# Patient Record
Sex: Female | Born: 1980 | Race: Black or African American | Hispanic: No | Marital: Single | Smoking: Current every day smoker
Health system: Southern US, Community
[De-identification: ages and names within clinical notes are randomized; demographics above are authoritative.]

## PROBLEM LIST (undated history)

## (undated) DIAGNOSIS — Z975 Presence of (intrauterine) contraceptive device: Secondary | ICD-10-CM

## (undated) DIAGNOSIS — N898 Other specified noninflammatory disorders of vagina: Secondary | ICD-10-CM

## (undated) DIAGNOSIS — B009 Herpesviral infection, unspecified: Secondary | ICD-10-CM

## (undated) DIAGNOSIS — N923 Ovulation bleeding: Secondary | ICD-10-CM

## (undated) DIAGNOSIS — O24419 Gestational diabetes mellitus in pregnancy, unspecified control: Secondary | ICD-10-CM

## (undated) DIAGNOSIS — E119 Type 2 diabetes mellitus without complications: Secondary | ICD-10-CM

## (undated) DIAGNOSIS — T8383XA Hemorrhage of genitourinary prosthetic devices, implants and grafts, initial encounter: Secondary | ICD-10-CM

## (undated) HISTORY — DX: Presence of (intrauterine) contraceptive device: Z97.5

## (undated) HISTORY — DX: Herpesviral infection, unspecified: B00.9

## (undated) HISTORY — PX: NO PAST SURGERIES: SHX2092

## (undated) HISTORY — DX: Ovulation bleeding: N92.3

## (undated) HISTORY — DX: Hemorrhage due to genitourinary prosthetic devices, implants and grafts, initial encounter: T83.83XA

## (undated) HISTORY — DX: Other specified noninflammatory disorders of vagina: N89.8

## (undated) HISTORY — DX: Gestational diabetes mellitus in pregnancy, unspecified control: O24.419

---

## 2007-03-08 ENCOUNTER — Emergency Department (HOSPITAL_COMMUNITY): Admission: EM | Admit: 2007-03-08 | Discharge: 2007-03-08 | Payer: Self-pay | Admitting: Emergency Medicine

## 2007-03-11 ENCOUNTER — Ambulatory Visit (HOSPITAL_COMMUNITY): Admission: RE | Admit: 2007-03-11 | Discharge: 2007-03-11 | Payer: Self-pay | Admitting: Nurse Practitioner

## 2008-12-14 ENCOUNTER — Emergency Department (HOSPITAL_COMMUNITY): Admission: EM | Admit: 2008-12-14 | Discharge: 2008-12-15 | Payer: Self-pay | Admitting: Emergency Medicine

## 2008-12-31 ENCOUNTER — Emergency Department (HOSPITAL_COMMUNITY): Admission: EM | Admit: 2008-12-31 | Discharge: 2008-12-31 | Payer: Self-pay | Admitting: Family Medicine

## 2009-07-13 ENCOUNTER — Inpatient Hospital Stay (HOSPITAL_COMMUNITY): Admission: AD | Admit: 2009-07-13 | Discharge: 2009-07-13 | Payer: Self-pay | Admitting: Obstetrics & Gynecology

## 2009-07-24 ENCOUNTER — Inpatient Hospital Stay (HOSPITAL_COMMUNITY): Admission: AD | Admit: 2009-07-24 | Discharge: 2009-07-24 | Payer: Self-pay | Admitting: Obstetrics and Gynecology

## 2009-09-20 ENCOUNTER — Inpatient Hospital Stay (HOSPITAL_COMMUNITY): Admission: AD | Admit: 2009-09-20 | Discharge: 2009-09-21 | Payer: Self-pay | Admitting: Obstetrics & Gynecology

## 2009-10-03 ENCOUNTER — Ambulatory Visit (HOSPITAL_COMMUNITY): Admission: RE | Admit: 2009-10-03 | Discharge: 2009-10-03 | Payer: Self-pay | Admitting: Obstetrics

## 2009-10-22 ENCOUNTER — Emergency Department (HOSPITAL_COMMUNITY): Admission: EM | Admit: 2009-10-22 | Discharge: 2009-10-22 | Payer: Self-pay | Admitting: Emergency Medicine

## 2009-12-11 ENCOUNTER — Inpatient Hospital Stay (HOSPITAL_COMMUNITY): Admission: AD | Admit: 2009-12-11 | Discharge: 2009-12-11 | Payer: Self-pay | Admitting: Obstetrics & Gynecology

## 2010-01-30 ENCOUNTER — Emergency Department (HOSPITAL_COMMUNITY): Admission: EM | Admit: 2010-01-30 | Discharge: 2010-01-30 | Payer: Self-pay | Admitting: Emergency Medicine

## 2010-02-21 ENCOUNTER — Encounter: Payer: Self-pay | Admitting: Obstetrics

## 2010-02-21 ENCOUNTER — Inpatient Hospital Stay (HOSPITAL_COMMUNITY): Admission: AD | Admit: 2010-02-21 | Discharge: 2010-02-23 | Payer: Self-pay | Admitting: Obstetrics

## 2010-07-24 IMAGING — US US OB COMP LESS 14 WK
1 series · 14 of 28 positions shown · non-contrast
Comparison: 12/15/2008.

CLINICAL DATA: 28-year-old female 6 weeks pregnant with bleeding.

OBSTETRIC <14 WK ULTRASOUND
TECHNIQUE: Transabdominal ultrasound was performed for evaluation
of the gestation as well as the maternal uterus and adnexal
regions.

[Series 1: us ob comp less 14 wks · 0.24mm/px · 14 of 43 slices shown]
[im 2/43]
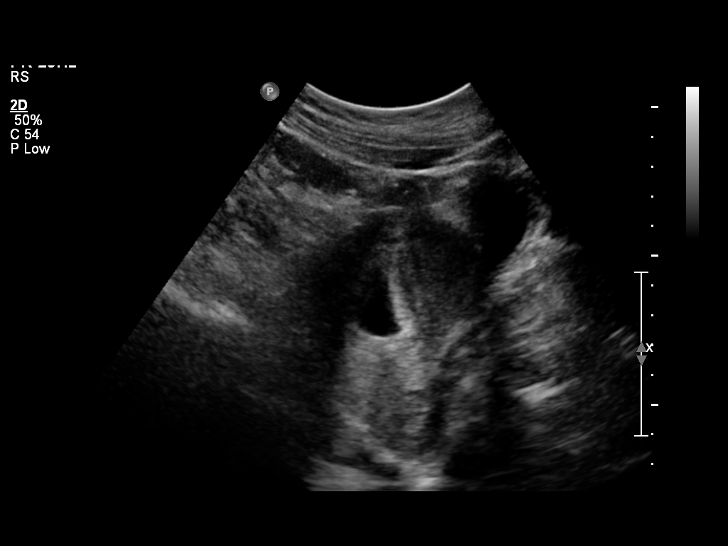
[im 5/43]
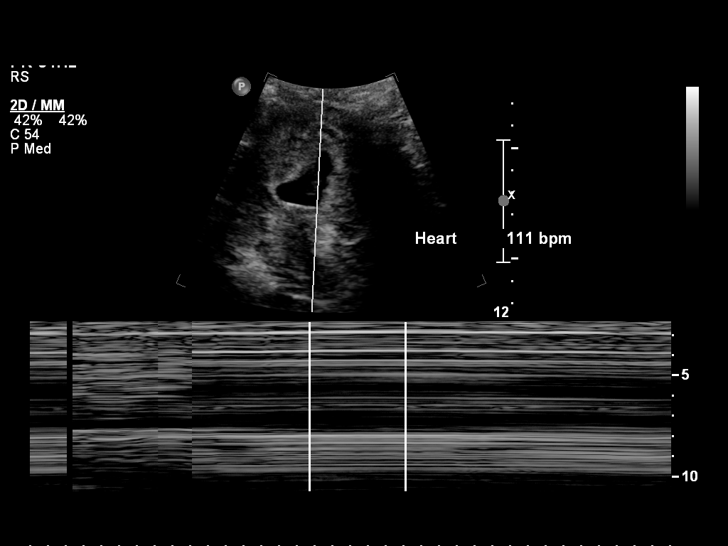
[im 8/43]
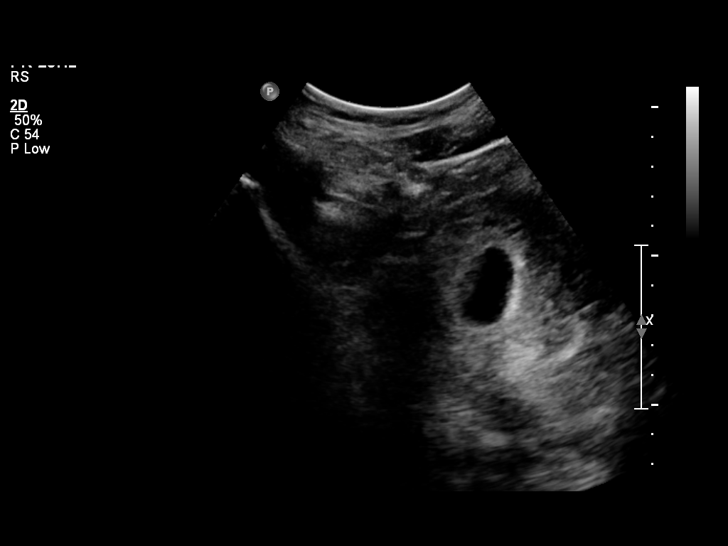
[im 11/43]
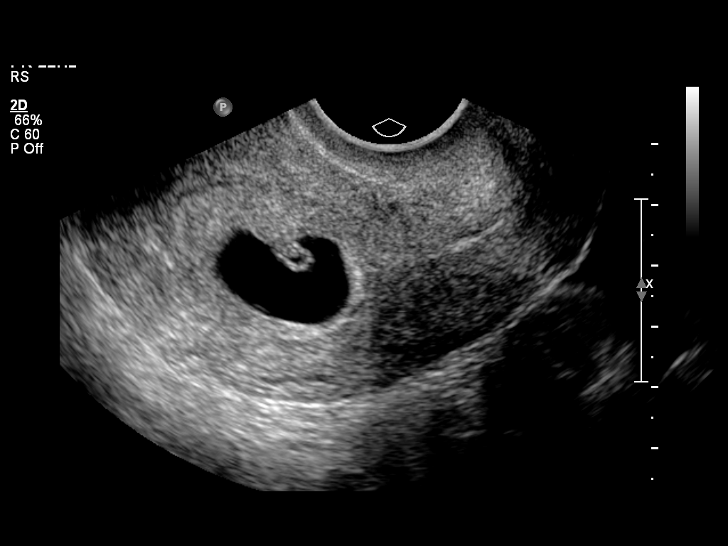
[im 15/43]
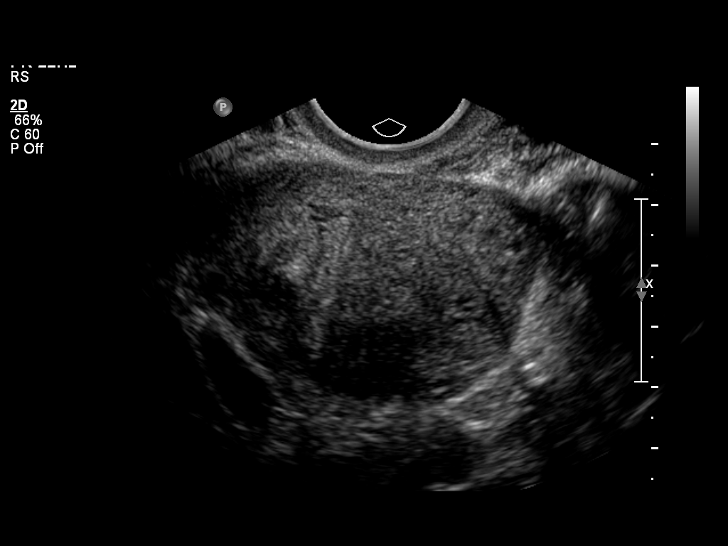
[im 18/43]
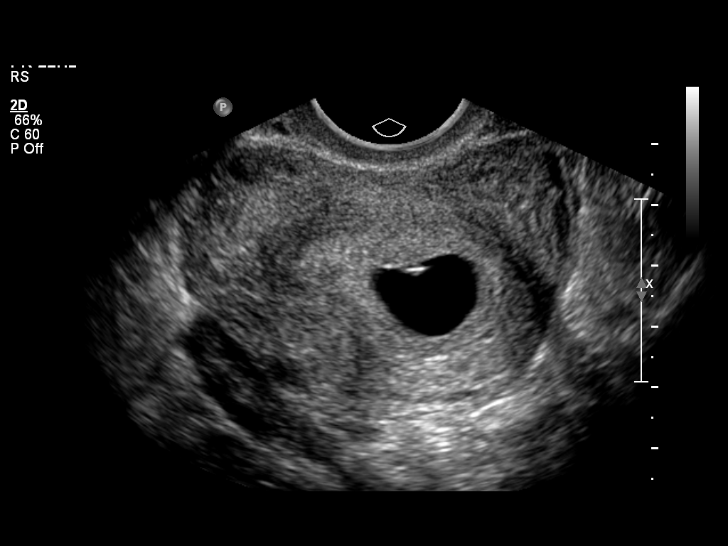
[im 21/43]
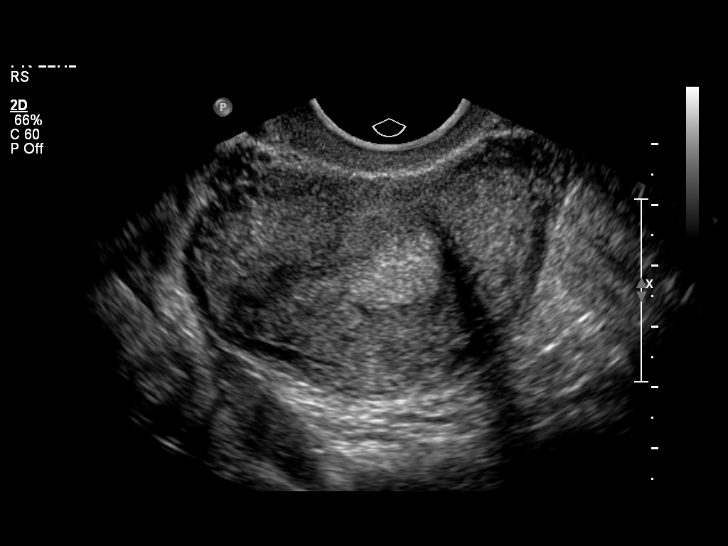
[im 24/43]
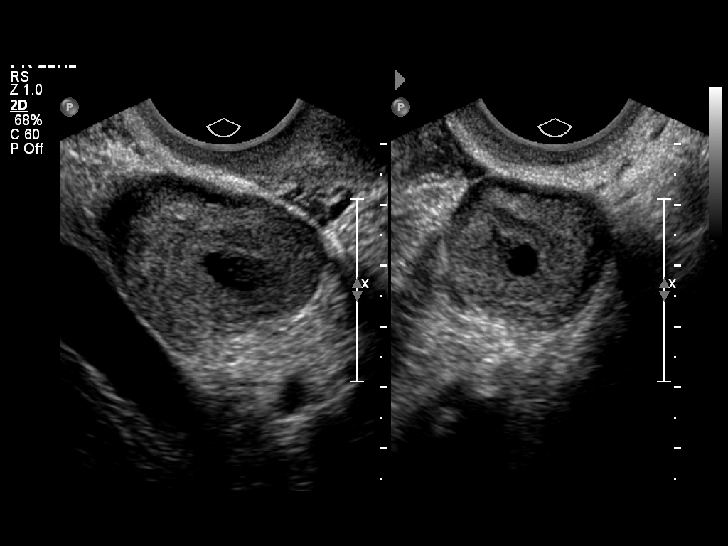
[im 27/43]
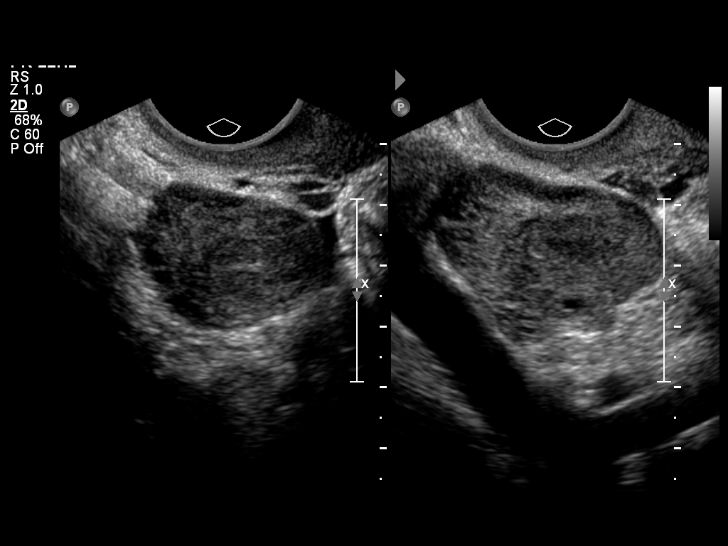
[im 30/43]
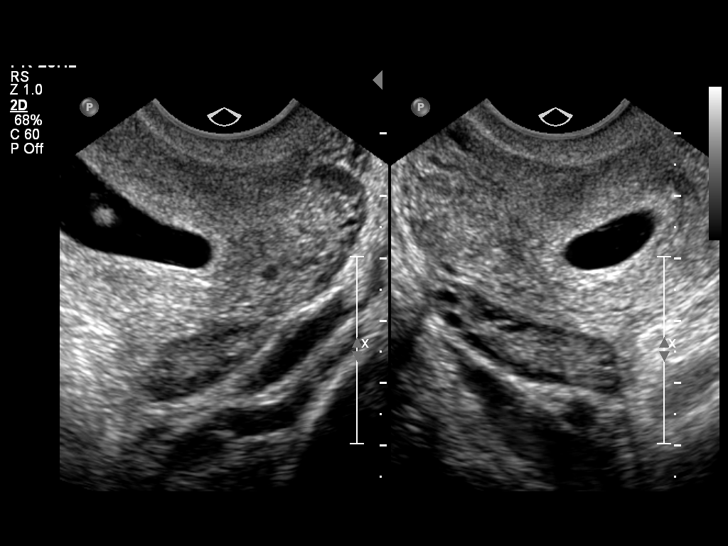
[im 33/43]
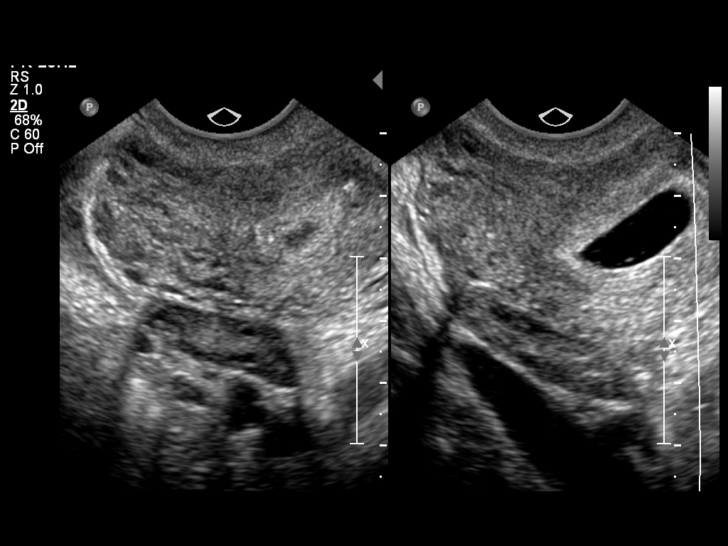
[im 36/43]
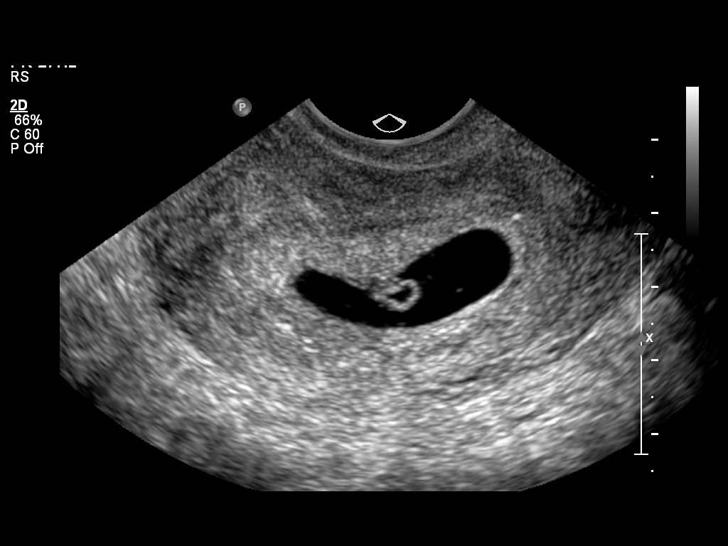
[im 39/43]
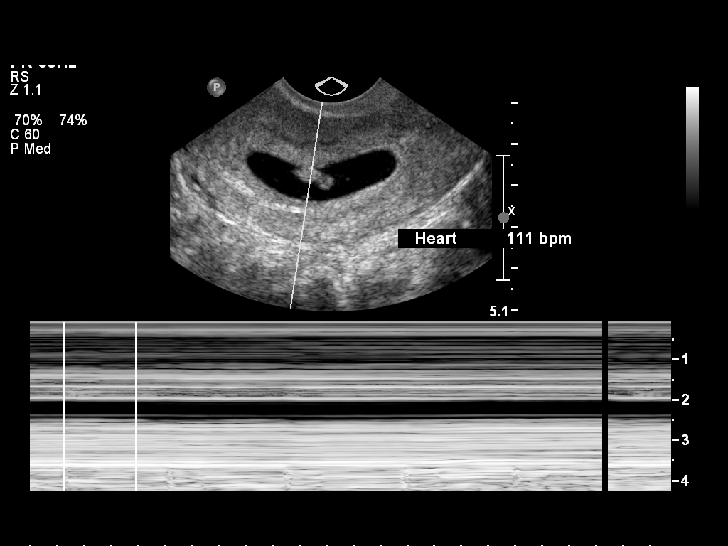
[im 43/43]
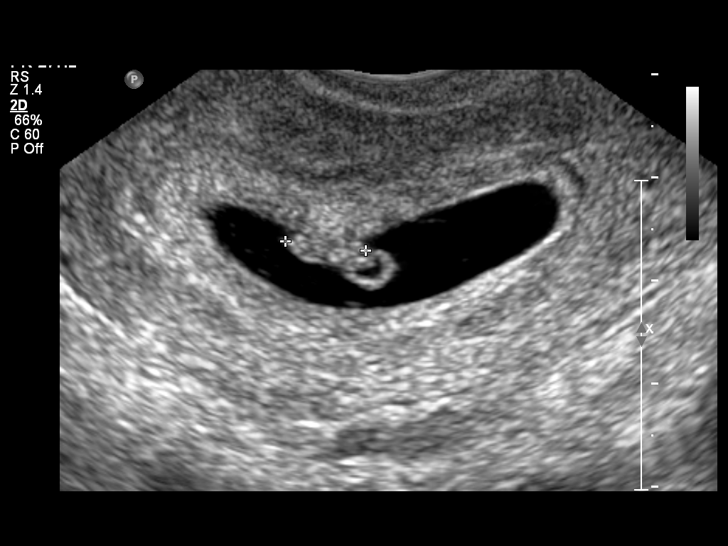

[14 of 28 positions shown; findings below may reference images not displayed]

Intrauterine gestational sac: Single
Yolk sac: Visible
Embryo: Visible
Cardiac Activity: Detected
Heart Rate: 111 bpm

CRL:  8 mm         6w  5d

Maternal uterus/Adnexae:
Trace pelvic free fluid in the cul-de-sac appears simple.  No
subchorionic hemorrhage.  Normal bilateral adnexa with left ovarian
corpus luteum cyst.
IMPRESSION: Viable singleton intrauterine pregnancy with estimated gestational
age of 6 weeks and 5 days by crown-rump length.  Trace simple
appearing pelvic free fluid.

## 2011-02-15 LAB — URINALYSIS, ROUTINE W REFLEX MICROSCOPIC
Nitrite: NEGATIVE
Specific Gravity, Urine: 1.025 (ref 1.005–1.030)
Urobilinogen, UA: 0.2 mg/dL (ref 0.0–1.0)
pH: 7 (ref 5.0–8.0)

## 2011-02-15 LAB — URINE CULTURE: Colony Count: 2000

## 2011-02-15 LAB — COMPREHENSIVE METABOLIC PANEL
AST: 14 U/L (ref 0–37)
Albumin: 2.7 g/dL — ABNORMAL LOW (ref 3.5–5.2)
Alkaline Phosphatase: 74 U/L (ref 39–117)
Chloride: 106 mEq/L (ref 96–112)
Creatinine, Ser: 0.54 mg/dL (ref 0.4–1.2)
GFR calc Af Amer: >60 mL/min (ref >60)
Potassium: 3.9 mEq/L (ref 3.5–5.1)
Total Bilirubin: 0.3 mg/dL (ref 0.3–1.2)
Total Protein: 6.4 g/dL (ref 6.0–8.3)

## 2011-02-15 LAB — CBC
Platelets: 221 10*3/uL (ref 150–400)
RDW: 13.1 % (ref 11.5–15.5)
WBC: 8.5 10*3/uL (ref 4.0–10.5)

## 2011-02-15 LAB — URINE MICROSCOPIC-ADD ON

## 2011-02-23 LAB — CBC
HCT: 30.5 % — ABNORMAL LOW (ref 36.0–46.0)
Hemoglobin: 10.2 g/dL — ABNORMAL LOW (ref 12.0–15.0)
MCHC: 33.6 g/dL (ref 30.0–36.0)
MCV: 84.7 fL (ref 78.0–100.0)
MCV: 86.4 fL (ref 78.0–100.0)
Platelets: 235 10*3/uL (ref 150–400)
RBC: 3.52 MIL/uL — ABNORMAL LOW (ref 3.87–5.11)
RBC: 4.22 MIL/uL (ref 3.87–5.11)
RDW: 14.5 % (ref 11.5–15.5)
WBC: 10.9 10*3/uL — ABNORMAL HIGH (ref 4.0–10.5)

## 2011-02-23 LAB — URINALYSIS, DIPSTICK ONLY
Bilirubin Urine: NEGATIVE
Glucose, UA: NEGATIVE mg/dL
Ketones, ur: NEGATIVE mg/dL
Protein, ur: NEGATIVE mg/dL
Urobilinogen, UA: 0.2 mg/dL (ref 0.0–1.0)

## 2011-02-23 LAB — COMPREHENSIVE METABOLIC PANEL
ALT: 14 U/L (ref 0–35)
AST: 17 U/L (ref 0–37)
Albumin: 2.8 g/dL — ABNORMAL LOW (ref 3.5–5.2)
Alkaline Phosphatase: 135 U/L — ABNORMAL HIGH (ref 39–117)
BUN: 7 mg/dL (ref 6–23)
Chloride: 104 mEq/L (ref 96–112)
Potassium: 3.9 mEq/L (ref 3.5–5.1)
Sodium: 134 mEq/L — ABNORMAL LOW (ref 135–145)
Total Bilirubin: 0.1 mg/dL — ABNORMAL LOW (ref 0.3–1.2)
Total Protein: 6.5 g/dL (ref 6.0–8.3)

## 2011-02-23 LAB — URINALYSIS, ROUTINE W REFLEX MICROSCOPIC
Bilirubin Urine: NEGATIVE
Glucose, UA: NEGATIVE mg/dL
Hgb urine dipstick: NEGATIVE
Ketones, ur: NEGATIVE mg/dL
Protein, ur: 30 mg/dL — AB

## 2011-02-23 LAB — GLUCOSE, CAPILLARY: Glucose-Capillary: 103 mg/dL — ABNORMAL HIGH (ref 70–99)

## 2011-02-23 LAB — URINE MICROSCOPIC-ADD ON

## 2011-02-23 LAB — RPR: RPR Ser Ql: NONREACTIVE

## 2011-02-23 LAB — URINE CULTURE: Culture: NO GROWTH

## 2011-02-23 LAB — GC/CHLAMYDIA PROBE AMP, GENITAL: Chlamydia, DNA Probe: NEGATIVE

## 2011-02-23 LAB — WET PREP, GENITAL

## 2011-03-04 LAB — WET PREP, GENITAL: Trich, Wet Prep: NONE SEEN

## 2011-03-05 LAB — WET PREP, GENITAL
Trich, Wet Prep: NONE SEEN
Yeast Wet Prep HPF POC: NONE SEEN

## 2011-03-05 LAB — COMPREHENSIVE METABOLIC PANEL
AST: 23 U/L (ref 0–37)
CO2: 23 mEq/L (ref 19–32)
Calcium: 9.1 mg/dL (ref 8.4–10.5)
Creatinine, Ser: 0.48 mg/dL (ref 0.4–1.2)
GFR calc Af Amer: >60 mL/min (ref >60)
GFR calc non Af Amer: >60 mL/min (ref >60)

## 2011-03-05 LAB — GC/CHLAMYDIA PROBE AMP, GENITAL: Chlamydia, DNA Probe: NEGATIVE

## 2011-03-05 LAB — CBC
MCHC: 33.6 g/dL (ref 30.0–36.0)
MCV: 87.1 fL (ref 78.0–100.0)
Platelets: 203 10*3/uL (ref 150–400)
RBC: 3.43 MIL/uL — ABNORMAL LOW (ref 3.87–5.11)

## 2011-03-05 LAB — URINALYSIS, ROUTINE W REFLEX MICROSCOPIC
Bilirubin Urine: NEGATIVE
Hgb urine dipstick: NEGATIVE
Protein, ur: NEGATIVE mg/dL
Urobilinogen, UA: 0.2 mg/dL (ref 0.0–1.0)

## 2011-03-08 LAB — URINALYSIS, ROUTINE W REFLEX MICROSCOPIC
Bilirubin Urine: NEGATIVE
Glucose, UA: NEGATIVE mg/dL
Ketones, ur: 15 mg/dL — AB
Leukocytes, UA: NEGATIVE
Protein, ur: NEGATIVE mg/dL

## 2011-03-08 LAB — GC/CHLAMYDIA PROBE AMP, GENITAL
Chlamydia, DNA Probe: NEGATIVE
GC Probe Amp, Genital: NEGATIVE

## 2011-03-08 LAB — URINE MICROSCOPIC-ADD ON

## 2011-03-08 LAB — POCT PREGNANCY, URINE: Preg Test, Ur: POSITIVE

## 2011-03-08 LAB — WET PREP, GENITAL: Yeast Wet Prep HPF POC: NONE SEEN

## 2011-03-16 LAB — URINALYSIS, ROUTINE W REFLEX MICROSCOPIC
Ketones, ur: 15 mg/dL — AB
Nitrite: NEGATIVE
pH: 7 (ref 5.0–8.0)

## 2011-03-16 LAB — CBC
Hemoglobin: 11.5 g/dL — ABNORMAL LOW (ref 12.0–15.0)
MCHC: 33 g/dL (ref 30.0–36.0)
MCV: 84.6 fL (ref 78.0–100.0)
Platelets: 257 10*3/uL (ref 150–400)
RBC: 4.13 MIL/uL (ref 3.87–5.11)
RDW: 13.9 % (ref 11.5–15.5)
WBC: 8.4 10*3/uL (ref 4.0–10.5)

## 2011-03-16 LAB — DIFFERENTIAL
Basophils Absolute: 0 10*3/uL (ref 0.0–0.1)
Eosinophils Relative: 1 % (ref 0–5)
Lymphocytes Relative: 30 % (ref 12–46)
Neutro Abs: 5.2 10*3/uL (ref 1.7–7.7)
Neutrophils Relative %: 62 % (ref 43–77)

## 2011-03-16 LAB — URINE MICROSCOPIC-ADD ON

## 2011-03-16 LAB — POCT I-STAT, CHEM 8
BUN: 10 mg/dL (ref 6–23)
Calcium, Ion: 1.14 mmol/L (ref 1.12–1.32)
Chloride: 105 mEq/L (ref 96–112)
Glucose, Bld: 92 mg/dL (ref 70–99)
HCT: 38 % (ref 36.0–46.0)

## 2011-03-17 LAB — POCT URINALYSIS DIP (DEVICE)
Bilirubin Urine: NEGATIVE
Glucose, UA: NEGATIVE mg/dL
Hgb urine dipstick: NEGATIVE
Specific Gravity, Urine: 1.025 (ref 1.005–1.030)

## 2011-03-17 LAB — GC/CHLAMYDIA PROBE AMP, GENITAL
Chlamydia, DNA Probe: NEGATIVE
GC Probe Amp, Genital: NEGATIVE

## 2011-03-17 LAB — WET PREP, GENITAL
Trich, Wet Prep: NONE SEEN
WBC, Wet Prep HPF POC: NONE SEEN

## 2011-11-09 ENCOUNTER — Encounter: Payer: Self-pay | Admitting: *Deleted

## 2011-11-09 DIAGNOSIS — F17200 Nicotine dependence, unspecified, uncomplicated: Secondary | ICD-10-CM | POA: Insufficient documentation

## 2011-11-09 DIAGNOSIS — J069 Acute upper respiratory infection, unspecified: Secondary | ICD-10-CM | POA: Insufficient documentation

## 2011-11-09 NOTE — ED Notes (Signed)
Cough, chest discomfort.  Sore throat

## 2011-11-10 ENCOUNTER — Emergency Department (HOSPITAL_COMMUNITY)
Admission: EM | Admit: 2011-11-10 | Discharge: 2011-11-10 | Disposition: A | Discharge status: Home / Self Care | Payer: Self-pay | Attending: Emergency Medicine | Admitting: Emergency Medicine

## 2011-11-10 DIAGNOSIS — J069 Acute upper respiratory infection, unspecified: Secondary | ICD-10-CM

## 2011-11-10 MED ORDER — IBUPROFEN 800 MG PO TABS
800.0000 mg | ORAL_TABLET | Freq: Once | ORAL | Status: AC
  Administered 2011-11-10: 800 mg via ORAL
  Filled 2011-11-10: qty 1

## 2011-11-10 MED ORDER — BENZONATATE 100 MG PO CAPS: 100.0000 mg | ORAL_CAPSULE | Freq: Three times a day (TID) | ORAL | Status: AC

## 2011-11-10 NOTE — ED Provider Notes (Signed)
History     CSN: [id number] Arrival date & time: 11/10/2011 12:15 AM   First MD Initiated Contact with Patient 11/10/11 0048      Chief Complaint  Patient presents with  . Cough    (Consider location/radiation/quality/duration/timing/severity/associated sxs/prior treatment) HPI Comments: URI symptoms for the past week. Tried mucinex without reliefHas chest discomfort only when coughing. No fever. Has mild sore throat no dysphasia or odynophagia.  Patient is a 30 y.o. female presenting with cough. The history is provided by the patient. No language interpreter was used.  Cough The current episode started more than 2 days ago (approx 1 week ago). The problem occurs every few minutes. The problem has not changed since onset.The cough is non-productive. There has been no fever. Associated symptoms include rhinorrhea and sore throat. Pertinent negatives include no chest pain, no chills, no ear congestion, no headaches and no shortness of breath. She has tried decongestants for the symptoms. The treatment provided no relief.    History reviewed. No pertinent past medical history.  History reviewed. No pertinent past surgical history.  History reviewed. No pertinent family history.  History  Substance Use Topics  . Smoking status: Current Everyday Smoker    Types: Cigarettes  . Smokeless tobacco: Not on file  . Alcohol Use: Yes    OB History    Grav Para Term Preterm Abortions TAB SAB Ect Mult Living                  Review of Systems  Constitutional: Positive for fatigue. Negative for fever, chills, activity change and appetite change.  HENT: Positive for congestion, sore throat and rhinorrhea. Negative for neck pain and neck stiffness.   Respiratory: Positive for cough and chest tightness. Negative for shortness of breath.   Cardiovascular: Negative for chest pain and palpitations.  Gastrointestinal: Negative for nausea, vomiting and abdominal pain.  Genitourinary: Negative  for dysuria, urgency, frequency and flank pain.  Neurological: Negative for dizziness, weakness, light-headedness, numbness and headaches.  All other systems reviewed and are negative.    Allergies  Review of patient's allergies indicates no known allergies.  Home Medications   Current Outpatient Rx  Name Route Sig Dispense Refill  . GUAIFENESIN ER 600 MG PO TB12 Oral Take 600 mg by mouth once.      Marland Kitchen LEVONORGESTREL 20 MCG/24HR IU IUD Intrauterine 1 each by Intrauterine route once.      Marland Kitchen BENZONATATE 100 MG PO CAPS Oral Take 1 capsule (100 mg total) by mouth every 8 (eight) hours. 21 capsule 0    BP 123/74  Pulse 98  Temp(Src) 98.8 F (37.1 C) (Oral)  Resp 20  Ht 5\' 5"  (1.651 m)  Wt 152 lb (68.947 kg)  BMI 25.29 kg/m2  SpO2 99%  LMP 11/08/2011  Physical Exam  Nursing note and vitals reviewed. Constitutional: She is oriented to person, place, and time. She appears well-developed and well-nourished. No distress.  HENT:  Head: Normocephalic and atraumatic.  Mouth/Throat: Oropharynx is clear and moist. No oropharyngeal exudate.  Eyes: Conjunctivae and EOM are normal. Pupils are equal, round, and reactive to light.  Neck: Normal range of motion. Neck supple.  Cardiovascular: Normal rate, regular rhythm and intact distal pulses.  Exam reveals no gallop and no friction rub.   No murmur heard. Pulmonary/Chest: Effort normal and breath sounds normal. No respiratory distress. She has no wheezes. She exhibits tenderness (chest wall tenderness).  Abdominal: Soft. Bowel sounds are normal. There is no tenderness.  Musculoskeletal:  Normal range of motion. She exhibits no tenderness.  Neurological: She is alert and oriented to person, place, and time.  Skin: Skin is warm and dry.    ED Course  Procedures (including critical care time)  Labs Reviewed - No data to display No results found.   1. Viral upper respiratory illness       MDM  Consistent with a viral upper  respiratory infection. Her lungs are clear there is no indication for chest x-ray at this time. I reassured the patient and encourage Tylenol and ibuprofen for chest wall strain secondary to coughing. I have no concern for pulmonary embolus at this time. She'll be discharged home with [facility] and instructions to continue aggressive oral hydration at home.        Dayton Bailiff, MD 11/10/11 [phone]

## 2012-10-17 ENCOUNTER — Emergency Department: Payer: Self-pay | Admitting: Emergency Medicine

## 2012-10-17 ENCOUNTER — Encounter (HOSPITAL_COMMUNITY): Payer: Self-pay | Admitting: *Deleted

## 2012-10-17 ENCOUNTER — Emergency Department (HOSPITAL_COMMUNITY)
Admission: EM | Admit: 2012-10-17 | Discharge: 2012-10-18 | Disposition: A | Discharge status: Home / Self Care | Payer: PRIVATE HEALTH INSURANCE | Attending: Emergency Medicine | Admitting: Emergency Medicine

## 2012-10-17 DIAGNOSIS — F17200 Nicotine dependence, unspecified, uncomplicated: Secondary | ICD-10-CM | POA: Insufficient documentation

## 2012-10-17 DIAGNOSIS — R05 Cough: Secondary | ICD-10-CM | POA: Insufficient documentation

## 2012-10-17 DIAGNOSIS — J209 Acute bronchitis, unspecified: Secondary | ICD-10-CM | POA: Insufficient documentation

## 2012-10-17 DIAGNOSIS — R059 Cough, unspecified: Secondary | ICD-10-CM | POA: Insufficient documentation

## 2012-10-17 DIAGNOSIS — R0602 Shortness of breath: Secondary | ICD-10-CM | POA: Insufficient documentation

## 2012-10-17 LAB — CBC
MCH: 28.6 pg (ref 26.0–34.0)
Platelet: 225 10*3/uL (ref 150–440)
RBC: 4.38 10*6/uL (ref 3.80–5.20)
WBC: 8.4 10*3/uL (ref 3.6–11.0)

## 2012-10-17 LAB — COMPREHENSIVE METABOLIC PANEL
Anion Gap: 1 — ABNORMAL LOW (ref 7–16)
Calcium, Total: 8.5 mg/dL (ref 8.5–10.1)
Co2: 27 mmol/L (ref 21–32)
EGFR (Non-African Amer.): >60
Osmolality: 270 (ref 275–301)
Potassium: 3.8 mmol/L (ref 3.5–5.1)
Sodium: 136 mmol/L (ref 136–145)

## 2012-10-17 NOTE — ED Notes (Signed)
Pt reports her chest hurting, productive cough & sob. Denies fever.

## 2012-10-18 ENCOUNTER — Emergency Department (HOSPITAL_COMMUNITY): Payer: PRIVATE HEALTH INSURANCE

## 2012-10-18 MED ORDER — PREDNISONE 50 MG PO TABS
60.0000 mg | ORAL_TABLET | Freq: Once | ORAL | Status: AC
  Administered 2012-10-18: 60 mg via ORAL
  Filled 2012-10-18: qty 5

## 2012-10-18 MED ORDER — BENZONATATE 200 MG PO CAPS: 200.0000 mg | ORAL_CAPSULE | Freq: Three times a day (TID) | ORAL | Status: DC | PRN

## 2012-10-18 MED ORDER — ALBUTEROL SULFATE (5 MG/ML) 0.5% IN NEBU
5.0000 mg | INHALATION_SOLUTION | Freq: Once | RESPIRATORY_TRACT | Status: AC
  Administered 2012-10-18: 5 mg via RESPIRATORY_TRACT
  Filled 2012-10-18: qty 1

## 2012-10-18 MED ORDER — IPRATROPIUM BROMIDE 0.02 % IN SOLN
0.5000 mg | Freq: Once | RESPIRATORY_TRACT | Status: AC
  Administered 2012-10-18: 0.5 mg via RESPIRATORY_TRACT
  Filled 2012-10-18: qty 2.5

## 2012-10-18 MED ORDER — BENZONATATE 100 MG PO CAPS
200.0000 mg | ORAL_CAPSULE | Freq: Once | ORAL | Status: AC
  Administered 2012-10-18: 200 mg via ORAL
  Filled 2012-10-18: qty 2

## 2012-10-18 MED ORDER — ALBUTEROL SULFATE HFA 108 (90 BASE) MCG/ACT IN AERS
2.0000 | INHALATION_SPRAY | Freq: Once | RESPIRATORY_TRACT | Status: AC
  Administered 2012-10-18: 2 via RESPIRATORY_TRACT
  Filled 2012-10-18: qty 6.7

## 2012-10-18 MED ORDER — PREDNISONE 20 MG PO TABS: 60.0000 mg | ORAL_TABLET | Freq: Every day | ORAL | Status: DC

## 2012-10-18 NOTE — ED Provider Notes (Signed)
History     CSN: [id number]  Arrival date & time 10/17/12  2326   First MD Initiated Contact with Patient 10/17/12 2338      Chief Complaint  Patient presents with  . Chest Pain  . Cough  . Shortness of Breath    (Consider location/radiation/quality/duration/timing/severity/associated sxs/prior treatment) HPI Comments: Maria Reynolds presents with cough,  Wheezing and shortness of breath which started late morning today.  She describes waking around 6 am this morning with chills and abdominal cramping.  She had three episodes of nonbloody diarrhea,  Then went back to bed and slept for several more hours.  When she woke she had developed a sore throat (now resolved after gargling with salt water) but persistent nonproductive cough,  Wheezing and right sided chest pressure.  She borrowed her mothers albuterol inhaler which resolved her symptoms temporarily.  She denies a history of asthma;  She is a 1 ppd smoker.  She has had no fevers or chills.  She denies ongoing diarrhea or abdominal pain and no nausea or vomiting.  Patient is a 31 y.o. female presenting with cough and shortness of breath. The history is provided by the patient.  Cough Associated symptoms include sore throat, shortness of breath and wheezing. Pertinent negatives include no chest pain and no headaches.  Shortness of Breath  Associated symptoms include sore throat, shortness of breath and wheezing. Pertinent negatives include no chest pain and no fever.    History reviewed. No pertinent past medical history.  History reviewed. No pertinent past surgical history.  History reviewed. No pertinent family history.  History  Substance Use Topics  . Smoking status: Current Every Day Smoker    Types: Cigarettes  . Smokeless tobacco: Not on file  . Alcohol Use: Yes    OB History    Grav Para Term Preterm Abortions TAB SAB Ect Mult Living                  Review of Systems  Constitutional: Negative for fever.    HENT: Positive for sore throat. Negative for congestion and neck pain.   Eyes: Negative.   Respiratory: Positive for chest tightness, shortness of breath and wheezing.   Cardiovascular: Negative for chest pain.  Gastrointestinal: Positive for diarrhea. Negative for nausea, vomiting and abdominal pain.  Genitourinary: Negative.   Musculoskeletal: Negative for joint swelling and arthralgias.  Skin: Negative.  Negative for rash and wound.  Neurological: Negative for dizziness, weakness, light-headedness, numbness and headaches.  Hematological: Negative.   Psychiatric/Behavioral: Negative.     Allergies  Review of patient's allergies indicates no known allergies.  Home Medications   Current Outpatient Rx  Name  Route  Sig  Dispense  Refill  . LEVONORGESTREL 20 MCG/24HR IU IUD   Intrauterine   1 each by Intrauterine route once.           . GUAIFENESIN ER 600 MG PO TB12   Oral   Take 600 mg by mouth once.             BP 140/84  Pulse 100  Temp 98.1 F (36.7 C) (Oral)  Resp 20  Ht 5\' 5"  (1.651 m)  Wt 154 lb (69.854 kg)  BMI 25.63 kg/m2  SpO2 97%  Physical Exam  Nursing note and vitals reviewed. Constitutional: She appears well-developed and well-nourished.  HENT:  Head: Normocephalic and atraumatic.  Eyes: Conjunctivae normal are normal.  Neck: Normal range of motion.  Cardiovascular: Normal rate, regular rhythm, normal  heart sounds and intact distal pulses.   Pulmonary/Chest: Effort normal. No accessory muscle usage. She has wheezes. She exhibits no tenderness.       Expiratory wheeze throughout all lung fields, prolonged expirations.  Abdominal: Soft. Bowel sounds are normal. She exhibits no distension. There is no tenderness.  Musculoskeletal: Normal range of motion.  Neurological: She is alert.  Skin: Skin is warm and dry.  Psychiatric: She has a normal mood and affect.    ED Course  Procedures (including critical care time)  Labs Reviewed - No data to  display No results found.   No diagnosis found.  12:44 AM Patient given albuterol 5/atrovent 0.5 mg neb,  Prednisone 60 mg PO.    1:16 AM wheezing resolved,  Adequate air movement,  No respiratory distress.  MDM  Acute bronchitis.  Pt prescribed pulse dose of prednisone 60 mg x 4 more days.  Albuterol mdi given. Prescription for tessalon to assist with cough.  Encouraged smoking cessation.        Burgess Amor, [place] 10/18/12 [phone]

## 2012-10-18 NOTE — ED Provider Notes (Signed)
31 y/o female with no hx of RAD presents with c/o cough productive of yellow phlegm and SOB.  She has had no fever, no swelling, no back pain.  Sx are onging X 1 day.  She has been using a friend's albuterol MDI with minimal improvement.  On exam had dec BS bilaterally with expiratory wheezing, speaks in full sentences and has no increased WOB.  CXR, bronchitis vs pna.  Albuterol neb ordered.  Medical screening examination/treatment/procedure(s) were conducted as a shared visit with non-physician practitioner(s) and myself.  I personally evaluated the patient during the encounter    Vida Roller, MD 10/18/12 0025

## 2012-10-18 NOTE — ED Provider Notes (Signed)
Medical screening examination/treatment/procedure(s) were conducted as a shared visit with non-physician practitioner(s) and myself.  I personally evaluated the patient during the encounter  Please see my separate respective documentation pertaining to this patient encounter   Vida Roller, MD 10/18/12 [phone]

## 2013-04-13 ENCOUNTER — Encounter: Payer: Self-pay | Admitting: *Deleted

## 2013-04-14 ENCOUNTER — Encounter: Payer: Self-pay | Admitting: Obstetrics & Gynecology

## 2013-04-14 ENCOUNTER — Ambulatory Visit (INDEPENDENT_AMBULATORY_CARE_PROVIDER_SITE_OTHER): Payer: BC Managed Care – PPO | Admitting: Obstetrics & Gynecology

## 2013-04-14 VITALS — BP 100/60 | Ht 63.0 in | Wt 158.0 lb

## 2013-04-14 DIAGNOSIS — N9089 Other specified noninflammatory disorders of vulva and perineum: Secondary | ICD-10-CM

## 2013-04-14 NOTE — Progress Notes (Signed)
Patient ID: Maria Reynolds, female   DOB: [birth year], 32 y.o.   MRN: [id number] Patient states she has a right groin and vulva lesion swelloing that occurs every 2-3 months.  No history of HSV Occurs aroung the time of her menses.  Exam Not present today No discharge no infection or boil or lymph node  Recommend return when the problem is present  Face to face 15 minutes

## 2013-05-30 ENCOUNTER — Ambulatory Visit: Payer: BC Managed Care – PPO | Admitting: Women's Health

## 2013-05-30 ENCOUNTER — Encounter: Payer: Self-pay | Admitting: *Deleted

## 2013-07-24 ENCOUNTER — Encounter: Payer: Self-pay | Admitting: Obstetrics and Gynecology

## 2013-07-24 ENCOUNTER — Ambulatory Visit (INDEPENDENT_AMBULATORY_CARE_PROVIDER_SITE_OTHER): Payer: BC Managed Care – PPO | Admitting: Obstetrics and Gynecology

## 2013-07-24 ENCOUNTER — Other Ambulatory Visit (HOSPITAL_COMMUNITY)
Admission: RE | Admit: 2013-07-24 | Discharge: 2013-07-24 | Disposition: A | Discharge status: Home / Self Care | Payer: BC Managed Care – PPO | Source: Ambulatory Visit | Attending: Obstetrics and Gynecology | Admitting: Obstetrics and Gynecology

## 2013-07-24 VITALS — BP 130/70 | Ht 63.5 in | Wt 157.2 lb

## 2013-07-24 DIAGNOSIS — L309 Dermatitis, unspecified: Secondary | ICD-10-CM

## 2013-07-24 DIAGNOSIS — Z1151 Encounter for screening for human papillomavirus (HPV): Secondary | ICD-10-CM | POA: Insufficient documentation

## 2013-07-24 DIAGNOSIS — Z3202 Encounter for pregnancy test, result negative: Secondary | ICD-10-CM

## 2013-07-24 DIAGNOSIS — Z01419 Encounter for gynecological examination (general) (routine) without abnormal findings: Secondary | ICD-10-CM | POA: Insufficient documentation

## 2013-07-24 NOTE — Progress Notes (Signed)
  Assessment:  Annual Gyn Exam   Plan:  1. pap smear done, next pap due 21yr 2. return annually or prn 3   Hx abnormal pap smear, Bx'd [place], then "fine:" Subjective:  Maria Reynolds is a 32 y.o. female G2P2 who presents for annual exam. No LMP recorded. Patient is not currently having periods (Reason: IUD). IUD is 3 yrs in situ. The patient has complaints today of none  The following portions of the patient's history were reviewed and updated as appropriate: allergies, current medications, past family history, past medical history, past social history, past surgical history and problem list.  SH none, G3P2011 stillborn [facility] [place] 2006(approx)  Review of Systems Constitutional: negative Gastrointestinal: negative Genitourinary:   Objective:  BP 130/70  Ht 5' 3.5" (1.613 m)  Wt 157 lb 3.2 oz (71.305 kg)  BMI 27.41 kg/m2   BMI: Body mass index is 27.41 kg/(m^2).  General Appearance: Alert, appropriate appearance for age. No acute distress HEENT: Grossly normal Neck / Thyroid:  Cardiovascular: RRR; normal S1, S2, no murmur Lungs: CTA bilaterally Back: No CVAT Breast Exam: No dimpling, nipple retraction or discharge. No masses or nodes. and No masses or nodes.No dimpling, nipple retraction or discharge. Gastrointestinal: Soft, non-tender, no masses or organomegaly Pelvic Exam: Vulva and vagina appear normal. Bimanual exam reveals normal uterus and adnexa. External genitalia: normal general appearance and no eczema Rectovaginal: normal rectal, no masses Lymphatic Exam: Non-palpable nodes in neck, clavicular, axillary, or inguinal regions Skin: no rash or abnormalities Neurologic: Normal gait and speech, no tremor  Psychiatric: Alert and oriented, appropriate affect.  Urinalysis:Not done  Maria Reynolds. MD Pgr [phone] 3:24 PM

## 2013-09-29 ENCOUNTER — Emergency Department (HOSPITAL_COMMUNITY)
Admission: EM | Admit: 2013-09-29 | Discharge: 2013-09-29 | Disposition: A | Discharge status: Home / Self Care | Payer: BC Managed Care – PPO | Attending: Emergency Medicine | Admitting: Emergency Medicine

## 2013-09-29 ENCOUNTER — Encounter (HOSPITAL_COMMUNITY): Payer: Self-pay | Admitting: Emergency Medicine

## 2013-09-29 ENCOUNTER — Emergency Department (HOSPITAL_COMMUNITY): Payer: BC Managed Care – PPO

## 2013-09-29 DIAGNOSIS — J209 Acute bronchitis, unspecified: Secondary | ICD-10-CM | POA: Insufficient documentation

## 2013-09-29 DIAGNOSIS — F17200 Nicotine dependence, unspecified, uncomplicated: Secondary | ICD-10-CM | POA: Insufficient documentation

## 2013-09-29 DIAGNOSIS — Z975 Presence of (intrauterine) contraceptive device: Secondary | ICD-10-CM | POA: Insufficient documentation

## 2013-09-29 DIAGNOSIS — IMO0002 Reserved for concepts with insufficient information to code with codable children: Secondary | ICD-10-CM | POA: Insufficient documentation

## 2013-09-29 MED ORDER — IPRATROPIUM BROMIDE 0.02 % IN SOLN
0.5000 mg | Freq: Once | RESPIRATORY_TRACT | Status: AC
  Administered 2013-09-29: 0.5 mg via RESPIRATORY_TRACT
  Filled 2013-09-29: qty 2.5

## 2013-09-29 MED ORDER — PREDNISONE 50 MG PO TABS
60.0000 mg | ORAL_TABLET | Freq: Once | ORAL | Status: AC
  Administered 2013-09-29: 60 mg via ORAL
  Filled 2013-09-29 (×2): qty 1

## 2013-09-29 MED ORDER — ALBUTEROL SULFATE (5 MG/ML) 0.5% IN NEBU
2.5000 mg | INHALATION_SOLUTION | Freq: Once | RESPIRATORY_TRACT | Status: AC
  Administered 2013-09-29: 2.5 mg via RESPIRATORY_TRACT
  Filled 2013-09-29: qty 0.5

## 2013-09-29 MED ORDER — PREDNISONE 50 MG PO TABS
ORAL_TABLET | ORAL | Status: AC
  Filled 2013-09-29: qty 1

## 2013-09-29 MED ORDER — ALBUTEROL SULFATE HFA 108 (90 BASE) MCG/ACT IN AERS
2.0000 | INHALATION_SPRAY | RESPIRATORY_TRACT | Status: DC | PRN
  Administered 2013-09-29: 2 via RESPIRATORY_TRACT
  Filled 2013-09-29: qty 6.7

## 2013-09-29 MED ORDER — PREDNISONE 20 MG PO TABS: 60.0000 mg | ORAL_TABLET | Freq: Every day | ORAL | Status: DC

## 2013-09-29 NOTE — ED Provider Notes (Signed)
CSN: [id number]     Arrival date & time 09/29/13  0359 History   First MD Initiated Contact with Patient 09/29/13 0405     Chief Complaint  Patient presents with  . Shortness of Breath   (Consider location/radiation/quality/duration/timing/severity/associated sxs/prior Treatment) Patient is a 32 y.o. female presenting with shortness of breath. The history is provided by the patient.  Shortness of Breath She has been having difficulty breathing for the last 3 days. This is worse with exertion and worse at night. There is associated cough productive of yellow sputum. She denies fever, chills, sweats. She feels tight in her chest but has not had any chest pain. There've been no arthralgias or myalgias. There's been no vomiting or diarrhea. She did see a physician yesterday and was given a steroid shot and antibiotics but was not given any medication to take at home. Symptoms are somewhat similar to an episode of acute bronchitis she had one year ago. Of note, she does smoke half pack of cigarettes a day.  No past medical history on file. Past Surgical History  Procedure Laterality Date  . No past surgeries     No family history on file. History  Substance Use Topics  . Smoking status: Current Every Day Smoker    Types: Cigarettes  . Smokeless tobacco: Never Used  . Alcohol Use: Yes     Comment: occ   OB History   Grav Para Term Preterm Abortions TAB SAB Ect Mult Living   2 2             Review of Systems  Respiratory: Positive for shortness of breath.   All other systems reviewed and are negative.    Allergies  Review of patient's allergies indicates no known allergies.  Home Medications   Current Outpatient Rx  Name  Route  Sig  Dispense  Refill  . benzonatate (TESSALON) 200 MG capsule   Oral   Take 1 capsule (200 mg total) by mouth 3 (three) times daily as needed for cough.   21 capsule   0   . guaiFENesin (MUCINEX) 600 MG 12 hr tablet   Oral   Take 600 mg by mouth  once.           Marland Kitchen levonorgestrel (MIRENA) 20 MCG/24HR IUD   Intrauterine   1 each by Intrauterine route once.           . Multiple Vitamin (MULTIVITAMIN) tablet   Oral   Take 1 tablet by mouth daily.         . predniSONE (DELTASONE) 20 MG tablet   Oral   Take 3 tablets (60 mg total) by mouth daily.   12 tablet   0    BP 129/79  Pulse 102  Temp(Src) 98.5 F (36.9 C) (Oral)  Resp 20  Ht 5\' 5"  (1.651 m)  Wt 152 lb (68.947 kg)  BMI 25.29 kg/m2  SpO2 99% Physical Exam  Nursing note and vitals reviewed.  32 year old female, resting comfortably and in no acute distress. Vital signs are significant for borderline tachycardia with heart rate 102. Oxygen saturation is 99%, which is normal. Head is normocephalic and atraumatic. PERRLA, EOMI. Oropharynx is clear. Neck is nontender and supple without adenopathy or JVD. Back is nontender and there is no CVA tenderness. Lungs have decreased air movement throughout with prolonged exhalation phase and faint wheezes. There are no rales or rhonchi. Chest is nontender. Heart has regular rate and rhythm without murmur. Abdomen is  soft, flat, nontender without masses or hepatosplenomegaly and peristalsis is normoactive. Extremities have no cyanosis or edema, full range of motion is present. Skin is warm and dry without rash. Neurologic: Mental status is normal, cranial nerves are intact, there are no motor or sensory deficits.  ED Course  Procedures (including critical care time) Imaging Review Dg Chest 2 View  09/29/2013   CLINICAL DATA:  Shortness of breath  EXAM: CHEST  2 VIEW  COMPARISON:  10/18/2012  FINDINGS: The heart size and mediastinal contours are within normal limits. Both lungs are clear of edema or consolidation. There is mild airway thickening centrally. Stable asymmetric pleural thickening on the right. The visualized skeletal structures are unremarkable.  IMPRESSION: Mild airway thickening suggesting bronchitis.    Electronically Signed   By: Tiburcio Pea M.D.   On: 09/29/2013 05:37   Images viewed by me.  MDM   1. Acute bronchitis    Cough and dyspnea which probably are acute bronchitis. Chest x-ray or be obtained to rule out pneumonia. She'll be given albuterol with ipratropium as well as prednisone. Old records are reviewed and she was seen in November 2013 with acute bronchitis.  She felt somewhat better afte nebulizer treatment. On exam, there is improved airflow. She's given a second albuterol with ipratropium nebulizer treatment and feels much better. On exam, her lungs now sound normal. She is discharged with prescription for prednisone and is given an albuterol inhaler to take home.   Dione Booze, MD 09/29/13 [phone]

## 2013-09-29 NOTE — ED Notes (Signed)
Patient c/o shortness of breath; states was seen at [facility] on 10/29 and given steroid shot and antibiotic injection, but states not getting any better.

## 2013-10-29 IMAGING — CR DG CHEST 2V
2 series · 2 of 2 positions shown · non-contrast
Comparison: None.

CLINICAL DATA: Shortness of breath

CHEST - 2 VIEW

[view not recorded (1 of 2)]
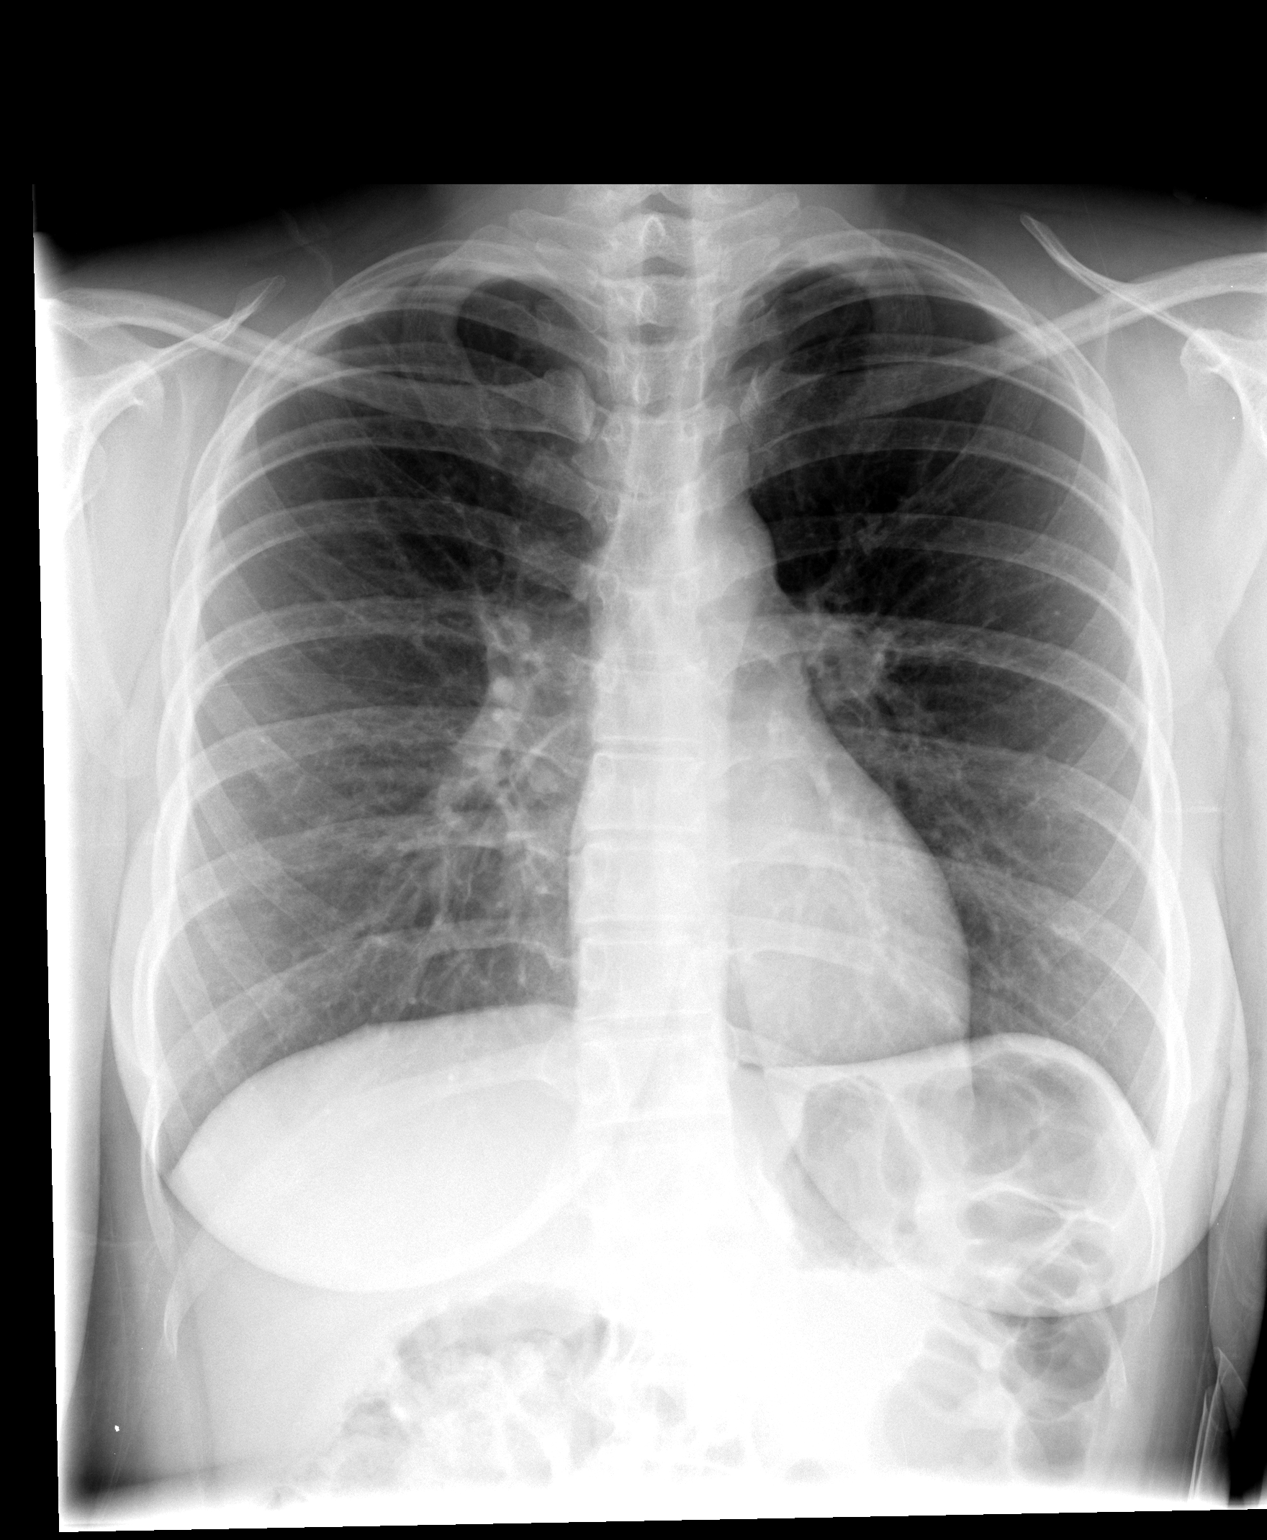

[view not recorded (2 of 2)]
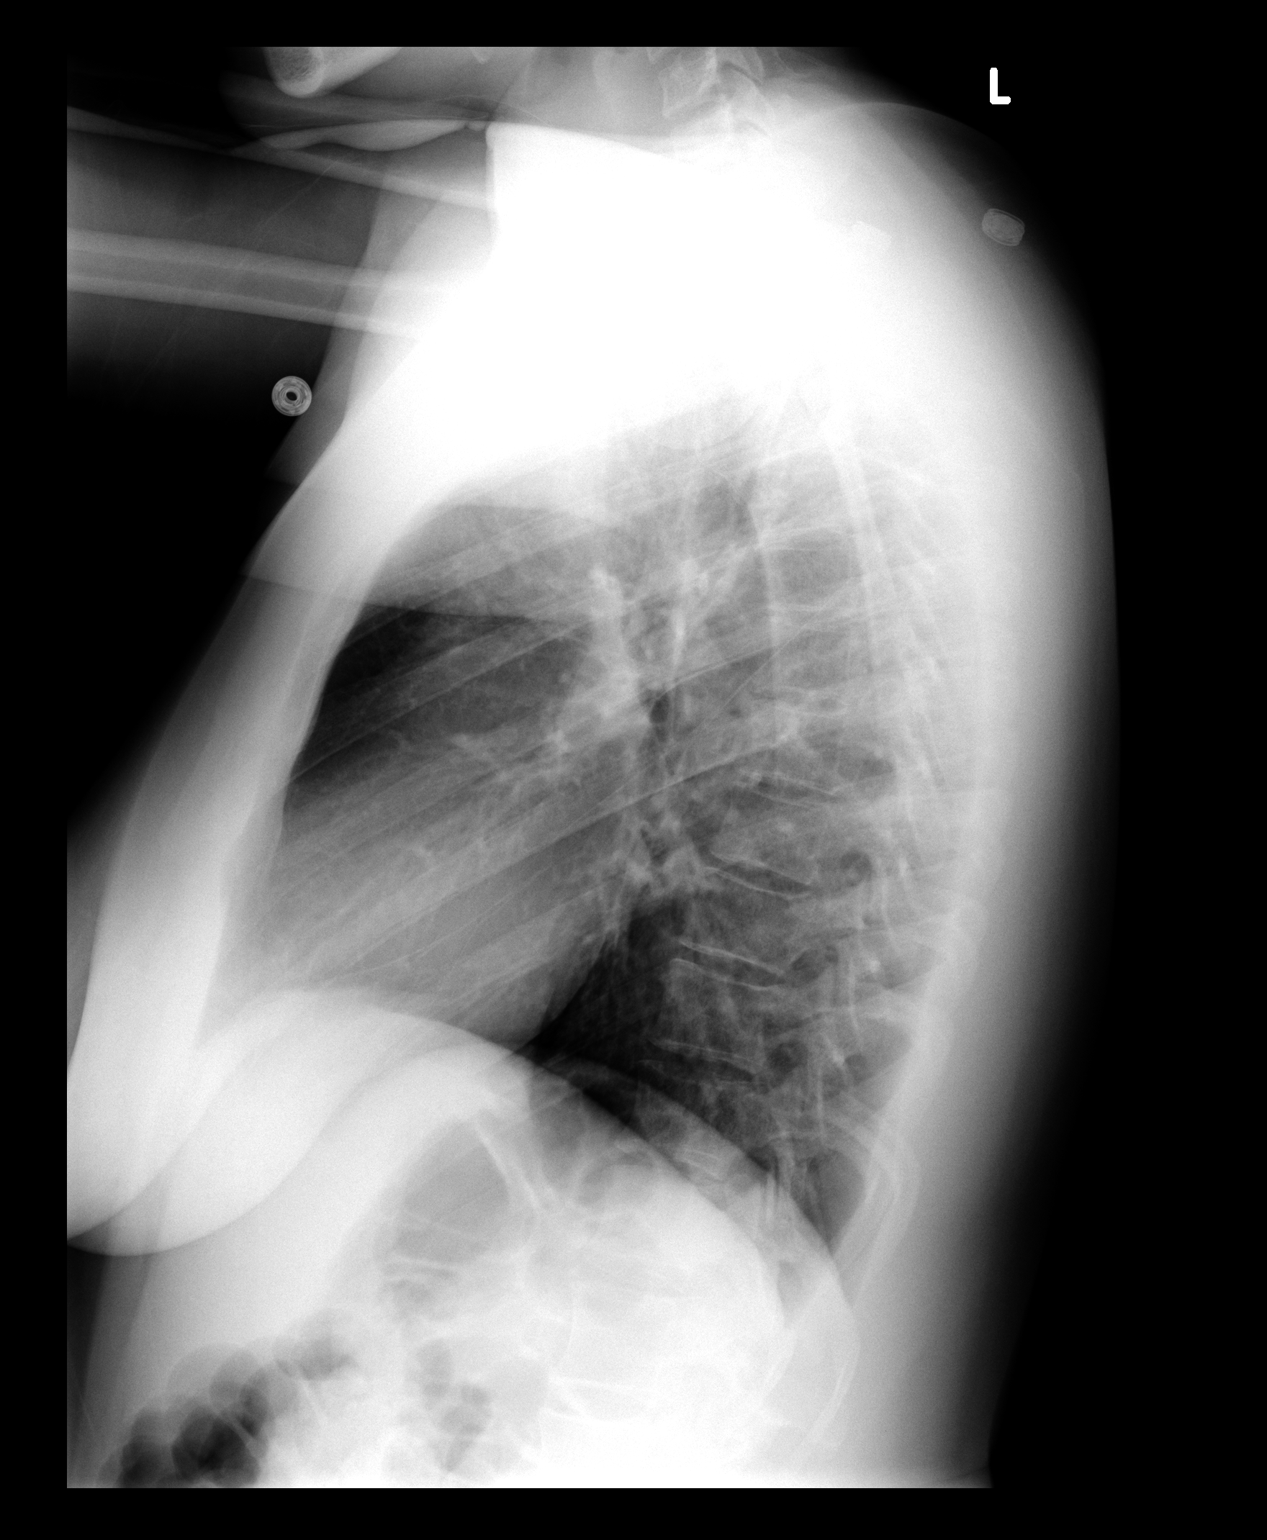

[2 of 2 positions shown; findings below may reference images not displayed]

FINDINGS: Mild bronchitic change.  Lungs are otherwise clear. No
pleural effusion or pneumothorax. The cardiomediastinal contours
are within normal limits. The visualized bones and soft tissues are
without significant appreciable abnormality.
IMPRESSION: Mild bronchitic change may be acute or chronic.  No
confluent airspace opacity.

## 2014-06-28 ENCOUNTER — Ambulatory Visit (INDEPENDENT_AMBULATORY_CARE_PROVIDER_SITE_OTHER): Payer: BC Managed Care – PPO | Admitting: Adult Health

## 2014-06-28 ENCOUNTER — Encounter: Payer: Self-pay | Admitting: Adult Health

## 2014-06-28 VITALS — BP 130/78 | Ht 64.0 in | Wt 140.5 lb

## 2014-06-28 DIAGNOSIS — N920 Excessive and frequent menstruation with regular cycle: Secondary | ICD-10-CM

## 2014-06-28 DIAGNOSIS — N923 Ovulation bleeding: Secondary | ICD-10-CM

## 2014-06-28 DIAGNOSIS — N898 Other specified noninflammatory disorders of vagina: Secondary | ICD-10-CM

## 2014-06-28 DIAGNOSIS — T8383XA Hemorrhage of genitourinary prosthetic devices, implants and grafts, initial encounter: Principal | ICD-10-CM

## 2014-06-28 DIAGNOSIS — B9689 Other specified bacterial agents as the cause of diseases classified elsewhere: Secondary | ICD-10-CM

## 2014-06-28 DIAGNOSIS — N760 Acute vaginitis: Secondary | ICD-10-CM

## 2014-06-28 DIAGNOSIS — A499 Bacterial infection, unspecified: Secondary | ICD-10-CM

## 2014-06-28 DIAGNOSIS — IMO0001 Reserved for inherently not codable concepts without codable children: Secondary | ICD-10-CM

## 2014-06-28 DIAGNOSIS — Z975 Presence of (intrauterine) contraceptive device: Secondary | ICD-10-CM

## 2014-06-28 DIAGNOSIS — N921 Excessive and frequent menstruation with irregular cycle: Secondary | ICD-10-CM

## 2014-06-28 HISTORY — DX: Other specified noninflammatory disorders of vagina: N89.8

## 2014-06-28 HISTORY — DX: Presence of (intrauterine) contraceptive device: Z97.5

## 2014-06-28 HISTORY — DX: Excessive and frequent menstruation with regular cycle: N92.0

## 2014-06-28 HISTORY — DX: Ovulation bleeding: N92.3

## 2014-06-28 LAB — POCT WET PREP (WET MOUNT): WBC WET PREP: POSITIVE

## 2014-06-28 MED ORDER — TINIDAZOLE 500 MG PO TABS: ORAL_TABLET | ORAL | Status: DC

## 2014-06-28 NOTE — Progress Notes (Signed)
Subjective:     Patient ID: Maria Reynolds, female   DOB: 1981/09/21, 33 y.o.   MRN: [id number]  HPI Maria Reynolds is a [age] year old black female, in complaining of spotting with IUD when not period, and has noticed discharge with odor, no new sex partners.She has some discomfort with sex at times.  Review of Systems See HPI Reviewed past medical,surgical, social and family history. Reviewed medications and allergies.     Objective:   Physical Exam BP 130/78  Ht 5\' 4"  (1.626 m)  Wt 140 lb 8 oz (63.73 kg)  BMI 24.10 kg/m2  LMP 06/23/2014 Skin warm and dry.Pelvic: external genitalia is normal in appearance, vagina: tan discharge with odor,no bleeding noted, cervix:smooth and bulbous,+IUD strings seen, uterus: normal size, shape and contour, non tender, no masses felt, adnexa: no masses or tenderness noted. Wet prep: + for clue cells and +WBCs. GC/CHL obtained.     Assessment:     Spotting with IUD Vaginal discharge  BV IUD in place    Plan:    Review handout on BV Rx tindamax 500 mg #8 take 4 now and 4 in am with 1 refill GC/CHL sent Follow up prn

## 2014-06-28 NOTE — Patient Instructions (Signed)
Bacterial Vaginosis Bacterial vaginosis is a vaginal infection that occurs when the normal balance of bacteria in the vagina is disrupted. It results from an overgrowth of certain bacteria. This is the most common vaginal infection in women of childbearing age. Treatment is important to prevent complications, especially in pregnant women, as it can cause a premature delivery. CAUSES  Bacterial vaginosis is caused by an increase in harmful bacteria that are normally present in smaller amounts in the vagina. Several different kinds of bacteria can cause bacterial vaginosis. However, the reason that the condition develops is not fully understood. RISK FACTORS Certain activities or behaviors can put you at an increased risk of developing bacterial vaginosis, including:  Having a new sex partner or multiple sex partners.  Douching.  Using an intrauterine device (IUD) for contraception. Women do not get bacterial vaginosis from toilet seats, bedding, swimming pools, or contact with objects around them. SIGNS AND SYMPTOMS  Some women with bacterial vaginosis have no signs or symptoms. Common symptoms include:  Grey vaginal discharge.  A fishlike odor with discharge, especially after sexual intercourse.  Itching or burning of the vagina and vulva.  Burning or pain with urination. DIAGNOSIS  Your health care provider will take a medical history and examine the vagina for signs of bacterial vaginosis. A sample of vaginal fluid may be taken. Your health care provider will look at this sample under a microscope to check for bacteria and abnormal cells. A vaginal pH test may also be done.  TREATMENT  Bacterial vaginosis may be treated with antibiotic medicines. These may be given in the form of a pill or a vaginal cream. A second round of antibiotics may be prescribed if the condition comes back after treatment.  HOME CARE INSTRUCTIONS   Only take over-the-counter or prescription medicines as  directed by your health care provider.  If antibiotic medicine was prescribed, take it as directed. Make sure you finish it even if you start to feel better.  Do not have sex until treatment is completed.  Tell all sexual partners that you have a vaginal infection. They should see their health care provider and be treated if they have problems, such as a mild rash or itching.  Practice safe sex by using condoms and only having one sex partner. SEEK MEDICAL CARE IF:   Your symptoms are not improving after 3 days of treatment.  You have increased discharge or pain.  You have a fever. MAKE SURE YOU:   Understand these instructions.  Will watch your condition.  Will get help right away if you are not doing well or get worse. FOR MORE INFORMATION  Centers for Disease Control and Prevention, Division of STD Prevention: www.cdc.gov/std American Sexual Health Association (ASHA): www.ashastd.org  Document Released: 11/16/2005 Document Revised: 09/06/2013 Document Reviewed: 06/28/2013 Exit[employer] Patient Information 2015 [employer]. This information is not intended to replace advice given to you by your health care provider. Make sure you discuss any questions you have with your health care provider. Take meds  Follow up prn 

## 2014-06-29 LAB — GC/CHLAMYDIA PROBE AMP
CT PROBE, AMP APTIMA: NEGATIVE
GC PROBE AMP APTIMA: NEGATIVE

## 2014-07-03 ENCOUNTER — Telehealth: Payer: Self-pay | Admitting: Adult Health

## 2014-07-03 NOTE — Telephone Encounter (Signed)
Pt informed of negative lab results.  

## 2014-07-06 NOTE — Telephone Encounter (Signed)
Attempted to contact pt x 3 with no success.  

## 2014-10-01 ENCOUNTER — Encounter: Payer: Self-pay | Admitting: Adult Health

## 2015-01-31 ENCOUNTER — Emergency Department (HOSPITAL_COMMUNITY)
Admission: EM | Admit: 2015-01-31 | Discharge: 2015-01-31 | Disposition: A | Discharge status: Home / Self Care | Payer: PRIVATE HEALTH INSURANCE | Attending: Emergency Medicine | Admitting: Emergency Medicine

## 2015-01-31 ENCOUNTER — Encounter (HOSPITAL_COMMUNITY): Payer: Self-pay | Admitting: *Deleted

## 2015-01-31 ENCOUNTER — Emergency Department (HOSPITAL_COMMUNITY): Payer: PRIVATE HEALTH INSURANCE

## 2015-01-31 DIAGNOSIS — J069 Acute upper respiratory infection, unspecified: Secondary | ICD-10-CM | POA: Insufficient documentation

## 2015-01-31 DIAGNOSIS — Z720 Tobacco use: Secondary | ICD-10-CM | POA: Insufficient documentation

## 2015-01-31 DIAGNOSIS — Z8742 Personal history of other diseases of the female genital tract: Secondary | ICD-10-CM | POA: Insufficient documentation

## 2015-01-31 DIAGNOSIS — M791 Myalgia: Secondary | ICD-10-CM | POA: Insufficient documentation

## 2015-01-31 DIAGNOSIS — R05 Cough: Secondary | ICD-10-CM

## 2015-01-31 DIAGNOSIS — Z975 Presence of (intrauterine) contraceptive device: Secondary | ICD-10-CM | POA: Insufficient documentation

## 2015-01-31 DIAGNOSIS — R059 Cough, unspecified: Secondary | ICD-10-CM

## 2015-01-31 DIAGNOSIS — Z79899 Other long term (current) drug therapy: Secondary | ICD-10-CM | POA: Insufficient documentation

## 2015-01-31 MED ORDER — HYDROCODONE-HOMATROPINE 5-1.5 MG/5ML PO SYRP: 5.0000 mL | ORAL_SOLUTION | Freq: Four times a day (QID) | ORAL | Status: DC | PRN

## 2015-01-31 MED ORDER — HYDROCOD POLST-CHLORPHEN POLST 10-8 MG/5ML PO LQCR
5.0000 mL | Freq: Once | ORAL | Status: AC
  Administered 2015-01-31: 5 mL via ORAL
  Filled 2015-01-31: qty 5

## 2015-01-31 MED ORDER — IBUPROFEN 800 MG PO TABS
800.0000 mg | ORAL_TABLET | Freq: Once | ORAL | Status: AC
  Administered 2015-01-31: 800 mg via ORAL
  Filled 2015-01-31: qty 1

## 2015-01-31 MED ORDER — IBUPROFEN 800 MG PO TABS: 800.0000 mg | ORAL_TABLET | Freq: Three times a day (TID) | ORAL | Status: DC

## 2015-01-31 MED ORDER — LORATADINE-PSEUDOEPHEDRINE ER 5-120 MG PO TB12: 1.0000 | ORAL_TABLET | Freq: Two times a day (BID) | ORAL | Status: DC

## 2015-01-31 NOTE — Discharge Instructions (Signed)
Cough, Adult  YOUR CHEST XRAY IS NEGATIVE FOR ACUTE PROBLEM. YOUR OXYGEN LEVEL IS WITHIN NORMAL LIMITS. PLEASE USE MEDICATIONS AS SUGGESTED. HYCODAN MAY CAUSE DROWSINESS, USE WITH CAUTION. PLEASE SEE YOUR MEDICAID ACCESS MD FOR RECHECK AND FOLLOW UP.                                                                                                                                        Upper Respiratory Infection, Adult An upper respiratory infection (URI) is also known as the common cold. It is often caused by a type of germ (virus). Colds are easily spread (contagious). You can pass it to others by kissing, coughing, sneezing, or drinking out of the same glass. Usually, you get better in 1 or 2 weeks.  HOME CARE   Only take medicine as told by your doctor.  Use a warm mist humidifier or breathe in steam from a hot shower.  Drink enough water and fluids to keep your pee (urine) clear or pale yellow.  Get plenty of rest.  Return to work when your temperature is back to normal or as told by your doctor. You may use a face mask and wash your hands to stop your cold from spreading. GET HELP RIGHT AWAY IF:   After the first few days, you feel you are getting worse.  You have questions about your medicine.  You have chills, shortness of breath, or brown or red spit (mucus).  You have yellow or brown snot (nasal discharge) or pain in the face, especially when you bend forward.  You have a fever, puffy (swollen) neck, pain when you swallow, or white spots in the back of your throat.  You have a bad headache, ear pain, sinus pain, or chest pain.  You have a high-pitched whistling sound when you breathe in and out (wheezing).  You have a lasting cough or cough up blood.  You have sore muscles or a stiff neck. MAKE SURE YOU:   Understand these instructions.  Will watch your condition.  Will get help right away if you are not doing well or get worse. Document Released: 05/04/2008 Document  Revised: 02/08/2012 Document Reviewed: 02/21/2014 [facility] Patient Information 2015 [place]. This information is not intended to replace advice given to you by your health care provider. Make sure you discuss any questions you have with your health care provider. A cough is a reflex. It helps you clear your throat and airways. A cough can help heal your body. A cough can last 2 or 3 weeks (acute) or may last more than 8 weeks (chronic). Some common causes of a cough can include an infection, allergy, or a cold. HOME CARE  Only take medicine as told by your doctor.  If given, take your medicines (antibiotics) as told. Finish them even if you start to feel better.  Use a cold steam vaporizer or humidifier in your  home. This can help loosen thick spit (secretions).  Sleep so you are almost sitting up (semi-upright). Use pillows to do this. This helps reduce coughing.  Rest as needed.  Stop smoking if you smoke. GET HELP RIGHT AWAY IF:  You have yellowish-white fluid (pus) in your thick spit.  Your cough gets worse.  Your medicine does not reduce coughing, and you are losing sleep.  You cough up blood.  You have trouble breathing.  Your pain gets worse and medicine does not help.  You have a fever. MAKE SURE YOU:   Understand these instructions.  Will watch your condition.  Will get help right away if you are not doing well or get worse. Document Released: 07/30/2011 Document Revised: 04/02/2014 Document Reviewed: 07/30/2011 [facility] Patient Information 2015 [place], [place]. This information is not intended to replace advice given to you by your health care provider. Make sure you discuss any questions you have with your health care provider.

## 2015-01-31 NOTE — ED Notes (Signed)
Pt states flu symptoms x 1 week with fever, chills, body aches and productive cough, yellow in color. States chest discomfort began last night, described as sharp and worse with cough. NAD

## 2015-01-31 NOTE — ED Provider Notes (Signed)
CSN: [id number]     Arrival date & time 01/31/15  [id number] History   First MD Initiated Contact with Patient 01/31/15 [phone]     Chief Complaint  Patient presents with  . Cough     (Consider location/radiation/quality/duration/timing/severity/associated sxs/prior Treatment) Patient is a 34 y.o. female presenting with cough. The history is provided by the patient.  Cough Cough characteristics:  Productive Sputum characteristics:  Yellow Severity:  Moderate Onset quality:  Gradual Duration:  1 week Timing:  Intermittent Progression:  Worsening Chronicity:  New Smoker: yes   Context: sick contacts and weather changes   Relieved by:  Nothing Worsened by:  Nothing tried Ineffective treatments:  None tried Associated symptoms: chest pain and myalgias   Associated symptoms: no eye discharge, no fever, no shortness of breath, no sore throat and no wheezing   Risk factors: no recent travel     Past Medical History  Diagnosis Date  . IUD (intrauterine device) in place 06/28/2014  . Intermenstrual spotting due to IUD 06/28/2014  . Vaginal discharge 06/28/2014   Past Surgical History  Procedure Laterality Date  . No past surgeries     Family History  Problem Relation Age of Onset  . Cancer Paternal Grandmother     lung   History  Substance Use Topics  . Smoking status: Current Every Day Smoker    Types: Cigarettes  . Smokeless tobacco: Never Used  . Alcohol Use: Yes     Comment: occ   OB History    Gravida Para Term Preterm AB TAB SAB Ectopic Multiple Living   2 2        1      Review of Systems  Constitutional: Negative for fever and activity change.       All ROS Neg except as noted in HPI  HENT: Positive for congestion. Negative for sore throat.   Eyes: Negative for photophobia and discharge.  Respiratory: Positive for cough. Negative for shortness of breath and wheezing.   Cardiovascular: Positive for chest pain. Negative for palpitations.  Gastrointestinal: Negative for  abdominal pain and blood in stool.  Genitourinary: Negative for dysuria, frequency and hematuria.  Musculoskeletal: Positive for myalgias. Negative for back pain, arthralgias and neck pain.  Skin: Negative.   Neurological: Negative for dizziness, seizures and speech difficulty.  Psychiatric/Behavioral: Negative for hallucinations and confusion.      Allergies  Review of patient's allergies indicates no known allergies.  Home Medications   Prior to Admission medications   Medication Sig Start Date End Date Taking? Authorizing Provider  levonorgestrel (MIRENA) 20 MCG/24HR IUD 1 each by Intrauterine route once.      Historical Provider, MD  Multiple Vitamin (MULTIVITAMIN) tablet Take 1 tablet by mouth daily.    Historical Provider, MD  tinidazole (TINDAMAX) 500 MG tablet Take 4 today and 4 in am 06/28/14   Adline PotterJennifer A Griffin, NP   LMP 01/04/2015 Physical Exam  Constitutional: She is oriented to person, place, and time. She appears well-developed and well-nourished.  Non-toxic appearance.  HENT:  Head: Normocephalic.  Right Ear: Tympanic membrane and external ear normal.  Left Ear: Tympanic membrane and external ear normal.  Nasal congestion  Eyes: EOM and lids are normal. Pupils are equal, round, and reactive to light.  Neck: Normal range of motion. Neck supple. Carotid bruit is not present.  Cardiovascular: Normal rate, regular rhythm, normal heart sounds, intact distal pulses and normal pulses.   Pulmonary/Chest: Breath sounds normal. No respiratory distress. She has no wheezes.  She has no rales.  Cough during exam. Rhonchi present. Symmetrical rise and fall of the chest.  Abdominal: Soft. Bowel sounds are normal. There is no tenderness. There is no guarding.  Musculoskeletal: Normal range of motion.  Lymphadenopathy:       Head (right side): No submandibular adenopathy present.       Head (left side): No submandibular adenopathy present.    She has no cervical adenopathy.   Neurological: She is alert and oriented to person, place, and time. She has normal strength. No cranial nerve deficit or sensory deficit.  Skin: Skin is warm and dry.  Psychiatric: She has a normal mood and affect. Her speech is normal.  Nursing note and vitals reviewed.   ED Course  Procedures (including critical care time) Labs Review Labs Reviewed - No data to display  Imaging Review No results found.   EKG Interpretation None      MDM  No hypoxia. Pt speaks in complete sentences. Pt in no distress. Rx for hycodan, claritin, and ibuprefen given to the patient.   Final diagnoses:  None    *I have reviewed nursing notes, vital signs, and all appropriate lab and imaging results for this patient.**    Ivery Quale, PA-C 02/04/15 2124  Donnetta Hutching, MD 02/09/15 0830

## 2015-01-31 NOTE — ED Notes (Signed)
Pt asking for something for pain. Patient states, "My chest really hurts when I cough." I explained she could be medicated once she is seen by a provider and the order is placed. Pt then asked for a gingerale. Explained that she needed to be seen by a provider first.

## 2015-05-16 ENCOUNTER — Encounter: Payer: Self-pay | Admitting: Advanced Practice Midwife

## 2015-05-16 ENCOUNTER — Ambulatory Visit (INDEPENDENT_AMBULATORY_CARE_PROVIDER_SITE_OTHER): Payer: Medicaid Other | Admitting: Advanced Practice Midwife

## 2015-05-16 VITALS — BP 106/72 | HR 69 | Ht 65.0 in | Wt 149.0 lb

## 2015-05-16 DIAGNOSIS — Z30432 Encounter for removal of intrauterine contraceptive device: Secondary | ICD-10-CM | POA: Diagnosis not present

## 2015-05-16 DIAGNOSIS — N898 Other specified noninflammatory disorders of vagina: Secondary | ICD-10-CM

## 2015-05-16 MED ORDER — NORGESTIM-ETH ESTRAD TRIPHASIC 0.18/0.215/0.25 MG-35 MCG PO TABS: 1.0000 | ORAL_TABLET | Freq: Every day | ORAL | Status: DC

## 2015-05-16 MED ORDER — MEDROXYPROGESTERONE ACETATE 150 MG/ML IM SUSP: 150.0000 mg | INTRAMUSCULAR | Status: DC

## 2015-05-16 NOTE — Progress Notes (Signed)
  Maria Reynolds 34 y.o.  There were no vitals filed for this visit. Past Medical History  Diagnosis Date  . IUD (intrauterine device) in place 06/28/2014  . Intermenstrual spotting due to IUD 06/28/2014  . Vaginal discharge 06/28/2014   Past Surgical History  Procedure Laterality Date  . No past surgeries     family history includes Cancer in her paternal grandmother.  Current outpatient prescriptions:  .  HYDROcodone-homatropine (HYCODAN) 5-1.5 MG/5ML syrup, Take 5 mLs by mouth every 6 (six) hours as needed for cough., Disp: 120 mL, Rfl: 0 .  ibuprofen (ADVIL,MOTRIN) 800 MG tablet, Take 1 tablet (800 mg total) by mouth 3 (three) times daily., Disp: 21 tablet, Rfl: 0 .  levonorgestrel (MIRENA) 20 MCG/24HR IUD, 1 each by Intrauterine route once.  , Disp: , Rfl:  .  loratadine-pseudoephedrine (CLARITIN-D 12 HOUR) 5-120 MG per tablet, Take 1 tablet by mouth 2 (two) times daily., Disp: 20 tablet, Rfl: 0 .  Multiple Vitamin (MULTIVITAMIN) tablet, Take 1 tablet by mouth daily., Disp: , Rfl:  .  tinidazole (TINDAMAX) 500 MG tablet, Take 4 today and 4 in am, Disp: 8 tablet, Rfl: 1    Here for IUD removal.  She had the Mirena IUD placed 5 years ago and would like it removed . She feels like she gets frequent BV. Today, she c/o intermittent odor/fishy smell and increased discharge.  Not having that today  Her plans for future contraception are COC's.  A graves speculum was placed, and the strings were visible.  Scant white d/c, no odor.  Wet prep completely negative. The strings were grasped with a curved Tresa Endo and the IUD easily removed.  Pt given IUD removal f/u instructions.   Partner in jail.  Start coc's today.  Try repHresh (or simial ) vaginal probiotic twice a week.

## 2015-05-16 NOTE — Patient Instructions (Signed)
RepHresh vaginal get  Use 2/times a week for vaginal health

## 2016-07-22 ENCOUNTER — Emergency Department (HOSPITAL_COMMUNITY)
Admission: EM | Admit: 2016-07-22 | Discharge: 2016-07-22 | Disposition: A | Discharge status: Home / Self Care | Payer: BLUE CROSS/BLUE SHIELD | Attending: Emergency Medicine | Admitting: Emergency Medicine

## 2016-07-22 ENCOUNTER — Encounter (HOSPITAL_COMMUNITY): Payer: Self-pay | Admitting: *Deleted

## 2016-07-22 ENCOUNTER — Emergency Department (HOSPITAL_COMMUNITY): Payer: BLUE CROSS/BLUE SHIELD

## 2016-07-22 DIAGNOSIS — F17210 Nicotine dependence, cigarettes, uncomplicated: Secondary | ICD-10-CM | POA: Insufficient documentation

## 2016-07-22 DIAGNOSIS — R0602 Shortness of breath: Secondary | ICD-10-CM | POA: Diagnosis not present

## 2016-07-22 DIAGNOSIS — E860 Dehydration: Secondary | ICD-10-CM | POA: Diagnosis not present

## 2016-07-22 DIAGNOSIS — Z791 Long term (current) use of non-steroidal anti-inflammatories (NSAID): Secondary | ICD-10-CM | POA: Insufficient documentation

## 2016-07-22 LAB — CBC WITH DIFFERENTIAL/PLATELET
BASOS ABS: 0 10*3/uL (ref 0.0–0.1)
Basophils Relative: 0 %
EOS PCT: 5 %
Eosinophils Absolute: 0.4 10*3/uL (ref 0.0–0.7)
HCT: 40.6 % (ref 36.0–46.0)
Hemoglobin: 13.2 g/dL (ref 12.0–15.0)
LYMPHS PCT: 30 %
Lymphs Abs: 2.2 10*3/uL (ref 0.7–4.0)
MCH: 28.5 pg (ref 26.0–34.0)
MCHC: 32.5 g/dL (ref 30.0–36.0)
MCV: 87.7 fL (ref 78.0–100.0)
MONO ABS: 0.4 10*3/uL (ref 0.1–1.0)
Monocytes Relative: 6 %
Neutro Abs: 4.3 10*3/uL (ref 1.7–7.7)
Neutrophils Relative %: 59 %
PLATELETS: 196 10*3/uL (ref 150–400)
RBC: 4.63 MIL/uL (ref 3.87–5.11)
RDW: 13 % (ref 11.5–15.5)
WBC: 7.4 10*3/uL (ref 4.0–10.5)

## 2016-07-22 LAB — BASIC METABOLIC PANEL
ANION GAP: 3 — AB (ref 5–15)
BUN: 10 mg/dL (ref 6–20)
CALCIUM: 8.1 mg/dL — AB (ref 8.9–10.3)
CO2: 26 mmol/L (ref 22–32)
Chloride: 110 mmol/L (ref 101–111)
Creatinine, Ser: 0.79 mg/dL (ref 0.44–1.00)
GFR calc Af Amer: >60 mL/min (ref >60)
GLUCOSE: 124 mg/dL — AB (ref 65–99)
Potassium: 3.5 mmol/L (ref 3.5–5.1)
Sodium: 139 mmol/L (ref 135–145)

## 2016-07-22 MED ORDER — SODIUM CHLORIDE 0.9 % IV BOLUS (SEPSIS)
1000.0000 mL | Freq: Once | INTRAVENOUS | Status: AC
  Administered 2016-07-22: 1000 mL via INTRAVENOUS

## 2016-07-22 NOTE — ED Notes (Signed)
Pt alert & oriented x4, stable gait. Patient given discharge instructions, paperwork & prescription(s). Patient  instructed to stop at the registration desk to finish any additional paperwork. Patient verbalized understanding. Pt left department w/ no further questions. 

## 2016-07-22 NOTE — ED Provider Notes (Signed)
[facility]-EMER[facility]ENCY DEPT Provider Note   CSN: [id number] Arrival date & time: 07/22/16  0120     History   Chief Complaint Chief Complaint  Patient presents with  . Shortness of Breath    HPI Maria Reynolds is a 35 y.o. female.  HPI  This is a 35 year old female who presents with shortness of breath and myalgias. Patient reports onset of symptoms yesterday. She gave plasma yesterday morning. She is given plasma in the past and has not had any symptoms. She reports shortness of breath specifically on exertion. No chest pain. No recent fevers or cough. She also reports generalized bodyaches.  Past Medical History:  Diagnosis Date  . Intermenstrual spotting due to IUD (HCC) 06/28/2014  . IUD (intrauterine device) in place 06/28/2014  . Vaginal discharge 06/28/2014    Patient Active Problem List   Diagnosis Date Noted  . Encounter for IUD removal 05/16/2015  . Intermenstrual spotting due to IUD (HCC) 06/28/2014  . Vaginal discharge 06/28/2014  . BV (bacterial vaginosis) 06/28/2014  . Eczema 07/24/2013    Past Surgical History:  Procedure Laterality Date  . NO PAST SURGERIES      OB History    Gravida Para Term Preterm AB Living   2 2       1    SAB TAB Ectopic Multiple Live Births                   Home Medications    Prior to Admission medications   Medication Sig Start Date End Date Taking? Authorizing Provider  ibuprofen (ADVIL,MOTRIN) 800 MG tablet Take 1 tablet (800 mg total) by mouth 3 (three) times daily. 01/31/15  Yes Ivery QualeHobson Bryant, PA-C  Multiple Vitamin (MULTIVITAMIN) tablet Take 1 tablet by mouth daily.   Yes Historical Provider, MD  Norgestimate-Ethinyl Estradiol Triphasic (TRI-SPRINTEC) 0.18/0.215/0.25 MG-35 MCG tablet Take 1 tablet by mouth daily. 05/16/15  Yes Scarlette CalicoFrances Cresenzo-Dishmon, CNM  HYDROcodone-homatropine (HYCODAN) 5-1.5 MG/5ML syrup Take 5 mLs by mouth every 6 (six) hours as needed for cough. Patient not taking: Reported on 05/16/2015 01/31/15   Ivery QualeHobson  Bryant, PA-C  levonorgestrel (MIRENA) 20 MCG/24HR IUD 1 each by Intrauterine route once.      Historical Provider, MD  loratadine-pseudoephedrine (CLARITIN-D 12 HOUR) 5-120 MG per tablet Take 1 tablet by mouth 2 (two) times daily. Patient not taking: Reported on 05/16/2015 01/31/15   Ivery QualeHobson Bryant, PA-C  medroxyPROGESTERone (DEPO-PROVERA) 150 MG/ML injection Inject 1 mL (150 mg total) into the muscle every 3 (three) months. 05/16/15   Jacklyn ShellFrances Cresenzo-Dishmon, CNM  tinidazole (TINDAMAX) 500 MG tablet Take 4 today and 4 in am Patient not taking: Reported on 05/16/2015 06/28/14   Adline PotterJennifer A Griffin, NP    Family History Family History  Problem Relation Age of Onset  . Cancer Paternal Grandmother     lung  . Ovarian cancer Maternal Aunt     Social History Social History  Substance Use Topics  . Smoking status: Current Every Day Smoker    Types: Cigarettes  . Smokeless tobacco: Never Used  . Alcohol use Yes     Comment: occ     Allergies   Review of patient's allergies indicates no known allergies.   Review of Systems Review of Systems  Constitutional: Negative for chills and fever.  Respiratory: Positive for shortness of breath. Negative for cough.   Cardiovascular: Negative for chest pain.  Gastrointestinal: Negative for nausea.  Neurological: Negative for dizziness.     Physical Exam Updated Vital Signs  BP 110/68 (BP Location: Left Arm)   Pulse 103   Temp 98.6 F (37 C) (Oral)   Resp 20   Ht 5\' 5"  (1.651 m)   Wt 140 lb (63.5 kg)   LMP 07/22/2016   SpO2 97%   BMI 23.30 kg/m   Physical Exam  Constitutional: She is oriented to person, place, and time. She appears well-developed and well-nourished. No distress.  HENT:  Head: Normocephalic and atraumatic.  Cardiovascular: Normal rate, regular rhythm and normal heart sounds.   No murmur heard. Pulmonary/Chest: Effort normal. No respiratory distress. She has wheezes.  Scant wheeze with good air movement  Abdominal:  Soft. There is no tenderness.  Neurological: She is alert and oriented to person, place, and time.  Skin: Skin is warm and dry.  Psychiatric: She has a normal mood and affect.  Nursing note and vitals reviewed.    ED Treatments / Results  Labs (all labs ordered are listed, but only abnormal results are displayed) Labs Reviewed  BASIC METABOLIC PANEL - Abnormal; Notable for the following:       Result Value   Glucose, Bld 124 (*)    Calcium 8.1 (*)    Anion gap 3 (*)    All other components within normal limits  CBC WITH DIFFERENTIAL/PLATELET    EKG  EKG Interpretation None       Radiology Dg Chest 2 View  Result Date: 07/22/2016 CLINICAL DATA:  Shortness of breath EXAM: CHEST  2 VIEW COMPARISON:  01/31/2015 FINDINGS: Normal heart size and mediastinal contours. No acute infiltrate or edema. No effusion or pneumothorax. No acute osseous findings. IMPRESSION: Negative chest. Electronically Signed   By: Marnee SpringJonathon  Watts M.D.   On: 07/22/2016 02:55    Procedures Procedures (including critical care time)  Medications Ordered in ED Medications  sodium chloride 0.9 % bolus 1,000 mL (0 mLs Intravenous Stopped 07/22/16 0313)     Initial Impression / Assessment and Plan / ED Course  I have reviewed the triage vital signs and the nursing notes.  Pertinent labs & imaging results that were available during my care of the patient were reviewed by me and considered in my medical decision making (see chart for details).  Clinical Course    Patient presents with dyspnea on exertion and bodyaches in the setting of recent plasma donation. She is nontoxic. Denies syncope. Physical exam is benign. Vital signs notable for mild tachycardia. Basic labwork reassuring. Chest x-ray is negative. Patient reports that she feels much better after hydration. Advised the patient to hydrate aggressively over the next 24 hours.  After history, exam, and medical workup I feel the patient has been  appropriately medically screened and is safe for discharge home. Pertinent diagnoses were discussed with the patient. Patient was given return precautions.   Final Clinical Impressions(s) / ED Diagnoses   Final diagnoses:  SOB (shortness of breath)  Dehydration    New Prescriptions New Prescriptions   No medications on file     Shon Batonourtney F Haleemah Buckalew, MD 07/22/16 [phone]

## 2016-07-22 NOTE — Discharge Instructions (Signed)
You were seen today for shortness of breath and body aches. This is likely related to donating plasma and resulting in dehydration. Your workup is otherwise reassuring. Given your improvement of symptoms with hydration, feel this is likely the cause. You need to make sure to aggressively hydrate with electrolyte rich fluids over the next 24 hours. If you develop worsening symptoms, lightheadedness, or any new symptoms she should be reevaluated.

## 2016-07-22 NOTE — ED Triage Notes (Signed)
Pt states joints aching & SOB after giving plasma yesterday.

## 2016-07-22 NOTE — ED Notes (Signed)
Pt ambulated around the nurses station. O2 sats remained 99 to 100 %.

## 2016-11-30 NOTE — L&D Delivery Note (Signed)
Delivery Note At 7:04 AM a viable and healthy female "Tymir" was delivered via Vaginal, Spontaneous Delivery (Presentation: ROA  ).  APGAR: 7, 8; weight 5 lb 15.2 oz (2700 g).   Placenta status: Spontaneous, but prolonged active management to delivery .  Cord: 3VC with the following complications: nuchal x1, reduced on perineum.  Anesthesia:  none Episiotomy: None Lacerations:  none Est. Blood Loss (mL):  250  Mom to postpartum.  Baby to Couplet care / Skin to Skin.  35yo [id number] at 38+6wks admitted with contractions but found to have severe range BP. Mag started, and 1 dose of iv labetalol given during pushing for severe pressures. PreE labs wnl. She progressed to fully dilated and began to push over an intact perineum. She wanted an epidural but was unable to get one due to spine curvature. She pushed out a baby well and he was placed on maternal abdomen. Her bleeding was minimal, but placenta delivered later with expression and trailing membranes that were teased out. A single manual evaluation of the LUS revealed a large clot, but her fundus was firm and no bleeding noted. No tears.  Placenta sent to pathology for smoking, mag, an area of denuded membranes that did not appear to be torn at all and small baby.  Christeen Douglas 09/02/2017, 7:46 AM

## 2017-02-12 DIAGNOSIS — Z8759 Personal history of other complications of pregnancy, childbirth and the puerperium: Secondary | ICD-10-CM | POA: Insufficient documentation

## 2017-04-02 ENCOUNTER — Emergency Department
Admission: EM | Admit: 2017-04-02 | Discharge: 2017-04-02 | Disposition: A | Discharge status: Home / Self Care | Payer: Medicaid Other | Attending: Emergency Medicine | Admitting: Emergency Medicine

## 2017-04-02 ENCOUNTER — Encounter: Payer: Self-pay | Admitting: Emergency Medicine

## 2017-04-02 DIAGNOSIS — R1031 Right lower quadrant pain: Secondary | ICD-10-CM | POA: Diagnosis not present

## 2017-04-02 DIAGNOSIS — Z3A16 16 weeks gestation of pregnancy: Secondary | ICD-10-CM | POA: Diagnosis not present

## 2017-04-02 DIAGNOSIS — O99332 Smoking (tobacco) complicating pregnancy, second trimester: Secondary | ICD-10-CM | POA: Diagnosis not present

## 2017-04-02 DIAGNOSIS — F17210 Nicotine dependence, cigarettes, uncomplicated: Secondary | ICD-10-CM | POA: Diagnosis not present

## 2017-04-02 DIAGNOSIS — Z79899 Other long term (current) drug therapy: Secondary | ICD-10-CM | POA: Insufficient documentation

## 2017-04-02 DIAGNOSIS — O26892 Other specified pregnancy related conditions, second trimester: Secondary | ICD-10-CM | POA: Insufficient documentation

## 2017-04-02 DIAGNOSIS — R109 Unspecified abdominal pain: Secondary | ICD-10-CM

## 2017-04-02 DIAGNOSIS — O26899 Other specified pregnancy related conditions, unspecified trimester: Secondary | ICD-10-CM

## 2017-04-02 LAB — URINALYSIS, COMPLETE (UACMP) WITH MICROSCOPIC
Bacteria, UA: NONE SEEN
Bilirubin Urine: NEGATIVE
GLUCOSE, UA: NEGATIVE mg/dL
HGB URINE DIPSTICK: NEGATIVE
Ketones, ur: NEGATIVE mg/dL
LEUKOCYTES UA: NEGATIVE
NITRITE: NEGATIVE
PH: 6 (ref 5.0–8.0)
Protein, ur: NEGATIVE mg/dL
Specific Gravity, Urine: 1.017 (ref 1.005–1.030)

## 2017-04-02 LAB — COMPREHENSIVE METABOLIC PANEL
ALK PHOS: 44 U/L (ref 38–126)
ALT: 62 U/L — AB (ref 14–54)
ANION GAP: 6 (ref 5–15)
AST: 43 U/L — ABNORMAL HIGH (ref 15–41)
Albumin: 3.3 g/dL — ABNORMAL LOW (ref 3.5–5.0)
BUN: 9 mg/dL (ref 6–20)
CALCIUM: 9.4 mg/dL (ref 8.9–10.3)
CO2: 23 mmol/L (ref 22–32)
CREATININE: 0.58 mg/dL (ref 0.44–1.00)
Chloride: 106 mmol/L (ref 101–111)
Glucose, Bld: 81 mg/dL (ref 65–99)
Potassium: 3.9 mmol/L (ref 3.5–5.1)
Sodium: 135 mmol/L (ref 135–145)
TOTAL PROTEIN: 7.1 g/dL (ref 6.5–8.1)
Total Bilirubin: 0.4 mg/dL (ref 0.3–1.2)

## 2017-04-02 LAB — CBC
HCT: 32.8 % — ABNORMAL LOW (ref 35.0–47.0)
Hemoglobin: 11.1 g/dL — ABNORMAL LOW (ref 12.0–16.0)
MCH: 29.3 pg (ref 26.0–34.0)
MCHC: 34 g/dL (ref 32.0–36.0)
MCV: 86.1 fL (ref 80.0–100.0)
PLATELETS: 223 10*3/uL (ref 150–440)
RBC: 3.81 MIL/uL (ref 3.80–5.20)
RDW: 13.2 % (ref 11.5–14.5)
WBC: 8.5 10*3/uL (ref 3.6–11.0)

## 2017-04-02 LAB — LIPASE, BLOOD: Lipase: 15 U/L (ref 11–51)

## 2017-04-02 NOTE — Discharge Instructions (Signed)
Please seek medical attention for any high fevers, chest pain, shortness of breath, change in behavior, persistent vomiting, bloody stool or any other new or concerning symptoms.  

## 2017-04-02 NOTE — ED Provider Notes (Signed)
[facility] Emergency Department Provider Note   ____________________________________________   I have reviewed the triage vital signs and the nursing notes.   HISTORY  Chief Complaint Abdominal Pain   History limited by: Not Limited   HPI Maria Reynolds is a 36 y.o. female who presents to the emergency department today because of concerns for abdominal pain. Patient states that she is red [redacted] weeks pregnant. The pain started this morning. It gradually got worse throughout the day. Located in the right lower quadrant. The patient denies any associated nausea or vomiting. She denies any fevers. No change in defecation or urination. Denies any vaginal discharge or bleeding.    Past Medical History:  Diagnosis Date  . Intermenstrual spotting due to IUD (HCC) 06/28/2014  . IUD (intrauterine device) in place 06/28/2014  . Vaginal discharge 06/28/2014    Patient Active Problem List   Diagnosis Date Noted  . Encounter for IUD removal 05/16/2015  . Intermenstrual spotting due to IUD (HCC) 06/28/2014  . Vaginal discharge 06/28/2014  . BV (bacterial vaginosis) 06/28/2014  . Eczema 07/24/2013    Past Surgical History:  Procedure Laterality Date  . NO PAST SURGERIES      Prior to Admission medications   Medication Sig Start Date End Date Taking? Authorizing Provider  HYDROcodone-homatropine (HYCODAN) 5-1.5 MG/5ML syrup Take 5 mLs by mouth every 6 (six) hours as needed for cough. Patient not taking: Reported on 05/16/2015 01/31/15   Ivery QualeHobson Bryant, PA-C  ibuprofen (ADVIL,MOTRIN) 800 MG tablet Take 1 tablet (800 mg total) by mouth 3 (three) times daily. 01/31/15   Ivery QualeHobson Bryant, PA-C  levonorgestrel (MIRENA) 20 MCG/24HR IUD 1 each by Intrauterine route once.      Historical Provider, MD  loratadine-pseudoephedrine (CLARITIN-D 12 HOUR) 5-120 MG per tablet Take 1 tablet by mouth 2 (two) times daily. Patient not taking: Reported on 05/16/2015 01/31/15   Ivery QualeHobson Bryant, PA-C   medroxyPROGESTERone (DEPO-PROVERA) 150 MG/ML injection Inject 1 mL (150 mg total) into the muscle every 3 (three) months. 05/16/15   Jacklyn ShellFrances Cresenzo-Dishmon, CNM  Multiple Vitamin (MULTIVITAMIN) tablet Take 1 tablet by mouth daily.    Historical Provider, MD  Norgestimate-Ethinyl Estradiol Triphasic (TRI-SPRINTEC) 0.18/0.215/0.25 MG-35 MCG tablet Take 1 tablet by mouth daily. 05/16/15   Jacklyn ShellFrances Cresenzo-Dishmon, CNM  tinidazole (TINDAMAX) 500 MG tablet Take 4 today and 4 in am Patient not taking: Reported on 05/16/2015 06/28/14   Adline PotterJennifer A Griffin, NP    Allergies Patient has no known allergies.  Family History  Problem Relation Age of Onset  . Cancer Paternal Grandmother     lung  . Ovarian cancer Maternal Aunt     Social History Social History  Substance Use Topics  . Smoking status: Current Every Day Smoker    Types: Cigarettes  . Smokeless tobacco: Never Used  . Alcohol use Yes     Comment: occ    Review of Systems Constitutional: No fever/chills Eyes: No visual changes. ENT: No sore throat. Cardiovascular: Denies chest pain. Respiratory: Denies shortness of breath. Gastrointestinal: Positive for abdominal pain.  No nausea, no vomiting.  No diarrhea.   Genitourinary: Negative for dysuria. Musculoskeletal: Negative for back pain. Skin: Negative for rash. Neurological: Negative for headaches, focal weakness or numbness.  ____________________________________________   PHYSICAL EXAM:  VITAL SIGNS: ED Triage Vitals [04/02/17 1056]  Enc Vitals Group     BP 124/90     Pulse Rate 92     Resp 18     Temp 98.9 F (  37.2 C)     Temp Source Oral     SpO2 100 %     Weight 165 lb (74.8 kg)     Height 5\' 5"  (1.651 m)     Head Circumference      Peak Flow      Pain Score 4   Constitutional: Alert and oriented. Well appearing and in no distress. Eyes: Conjunctivae are normal. Normal extraocular movements. ENT   Head: Normocephalic and atraumatic.   Nose: No  congestion/rhinnorhea.   Mouth/Throat: Mucous membranes are moist.   Neck: No stridor. Hematological/Lymphatic/Immunilogical: No cervical lymphadenopathy. Cardiovascular: Normal rate, regular rhythm.  No murmurs, rubs, or gallops.  Respiratory: Normal respiratory effort without tachypnea nor retractions. Breath sounds are clear and equal bilaterally. No wheezes/rales/rhonchi. Gastrointestinal: Soft and minimally tender in the right side of the abdomen. No rebound. No guarding.  Genitourinary: Deferred Musculoskeletal: Normal range of motion in all extremities. No lower extremity edema. Neurologic:  Normal speech and language. No gross focal neurologic deficits are appreciated.  Skin:  Skin is warm, dry and intact. No rash noted. Psychiatric: Mood and affect are normal. Speech and behavior are normal. Patient exhibits appropriate insight and judgment.  ____________________________________________    LABS (pertinent positives/negatives)  Labs Reviewed  COMPREHENSIVE METABOLIC PANEL - Abnormal; Notable for the following:       Result Value   Albumin 3.3 (*)    AST 43 (*)    ALT 62 (*)    All other components within normal limits  CBC - Abnormal; Notable for the following:    Hemoglobin 11.1 (*)    HCT 32.8 (*)    All other components within normal limits  URINALYSIS, COMPLETE (UACMP) WITH MICROSCOPIC - Abnormal; Notable for the following:    Color, Urine YELLOW (*)    APPearance CLEAR (*)    Squamous Epithelial / LPF 0-5 (*)    All other components within normal limits  LIPASE, BLOOD  HCG, QUANTITATIVE, PREGNANCY     ____________________________________________   EKG  None  ____________________________________________    RADIOLOGY  Bedside US with good fetal movement, heart rate 158  ____________________________________________   PROCEDURES  Procedures  ____________________________________________   INITIAL IMPRESSION / ASSESSMENT AND PLAN / ED  COURSE  Pertinent labs & imaging results that were available during my care of the patient were reviewed by me and considered in my medical decision making (see chart for details).  Patient presented to the emergency department today because of concerns for abdominal pain. She is mildly tender in the right side. Had a discussion with the patient given concerns for possible appendicitis however given lack of fever, nausea vomiting or change in defecation and do not feel that any further workup is necessary to evaluate for appendicitis. Did however discuss with patient appendicitis return precautions.  ____________________________________________   FINAL CLINICAL IMPRESSION(S) / ED DIAGNOSES  Final diagnoses:  Abdominal pain during pregnancy, antepartum     Note: This dictation was prepared with Dragon dictation. Any transcriptional errors that result from this process are unintentional     Phineas Semen, MD 04/02/17 1315

## 2017-04-02 NOTE — ED Triage Notes (Signed)
Pt reports right lower abdominal cramping that began today. Pt states she is [redacted] weeks pregnant. Denies vaginal bleeding or fluid leaking. Denies NVD.

## 2017-05-30 ENCOUNTER — Emergency Department
Admission: EM | Admit: 2017-05-30 | Discharge: 2017-05-30 | Disposition: A | Discharge status: Home / Self Care | Payer: Managed Care, Other (non HMO) | Attending: Emergency Medicine | Admitting: Emergency Medicine

## 2017-05-30 DIAGNOSIS — R079 Chest pain, unspecified: Secondary | ICD-10-CM | POA: Diagnosis present

## 2017-05-30 DIAGNOSIS — M549 Dorsalgia, unspecified: Secondary | ICD-10-CM | POA: Diagnosis not present

## 2017-05-30 DIAGNOSIS — Z3A25 25 weeks gestation of pregnancy: Secondary | ICD-10-CM | POA: Diagnosis not present

## 2017-05-30 DIAGNOSIS — Z5321 Procedure and treatment not carried out due to patient leaving prior to being seen by health care provider: Secondary | ICD-10-CM | POA: Insufficient documentation

## 2017-05-30 LAB — BASIC METABOLIC PANEL
ANION GAP: 6 (ref 5–15)
BUN: 8 mg/dL (ref 6–20)
CHLORIDE: 105 mmol/L (ref 101–111)
CO2: 23 mmol/L (ref 22–32)
Calcium: 8.9 mg/dL (ref 8.9–10.3)
Creatinine, Ser: 0.54 mg/dL (ref 0.44–1.00)
GFR calc Af Amer: >60 mL/min (ref >60)
Glucose, Bld: 113 mg/dL — ABNORMAL HIGH (ref 65–99)
POTASSIUM: 3.6 mmol/L (ref 3.5–5.1)
SODIUM: 134 mmol/L — AB (ref 135–145)

## 2017-05-30 LAB — CBC
HEMATOCRIT: 31.8 % — AB (ref 35.0–47.0)
HEMOGLOBIN: 10.7 g/dL — AB (ref 12.0–16.0)
MCH: 28.8 pg (ref 26.0–34.0)
MCHC: 33.6 g/dL (ref 32.0–36.0)
MCV: 85.8 fL (ref 80.0–100.0)
Platelets: 238 10*3/uL (ref 150–440)
RBC: 3.7 MIL/uL — AB (ref 3.80–5.20)
RDW: 12.7 % (ref 11.5–14.5)
WBC: 9.3 10*3/uL (ref 3.6–11.0)

## 2017-05-30 LAB — TROPONIN I: Troponin I: <0.03 ng/mL (ref <0.03)

## 2017-05-30 NOTE — ED Notes (Signed)
[facility], has not been seen in the hospital this pregnancy.

## 2017-05-30 NOTE — ED Triage Notes (Signed)
Pt reports chest pain that started about 2-3 hours ago, states the pain is near the center of her chest to the right side. Pt states the pain is radiating to her back as well. Pt c/o headache. Pt has not taken anything for pain today. Pt is 25-[redacted] weeks pregnant. Due date is 09/10/2017.  Denies any SOB, N/V, dizziness.  G3P1A1

## 2017-07-19 DIAGNOSIS — D509 Iron deficiency anemia, unspecified: Secondary | ICD-10-CM | POA: Insufficient documentation

## 2017-07-19 DIAGNOSIS — Z87891 Personal history of nicotine dependence: Secondary | ICD-10-CM | POA: Insufficient documentation

## 2017-08-02 IMAGING — DX DG CHEST 2V
2 series · 2 of 2 positions shown · non-contrast
Comparison: 01/31/2015

CLINICAL DATA: Shortness of breath

EXAM:
CHEST  2 VIEW

[chest pa]
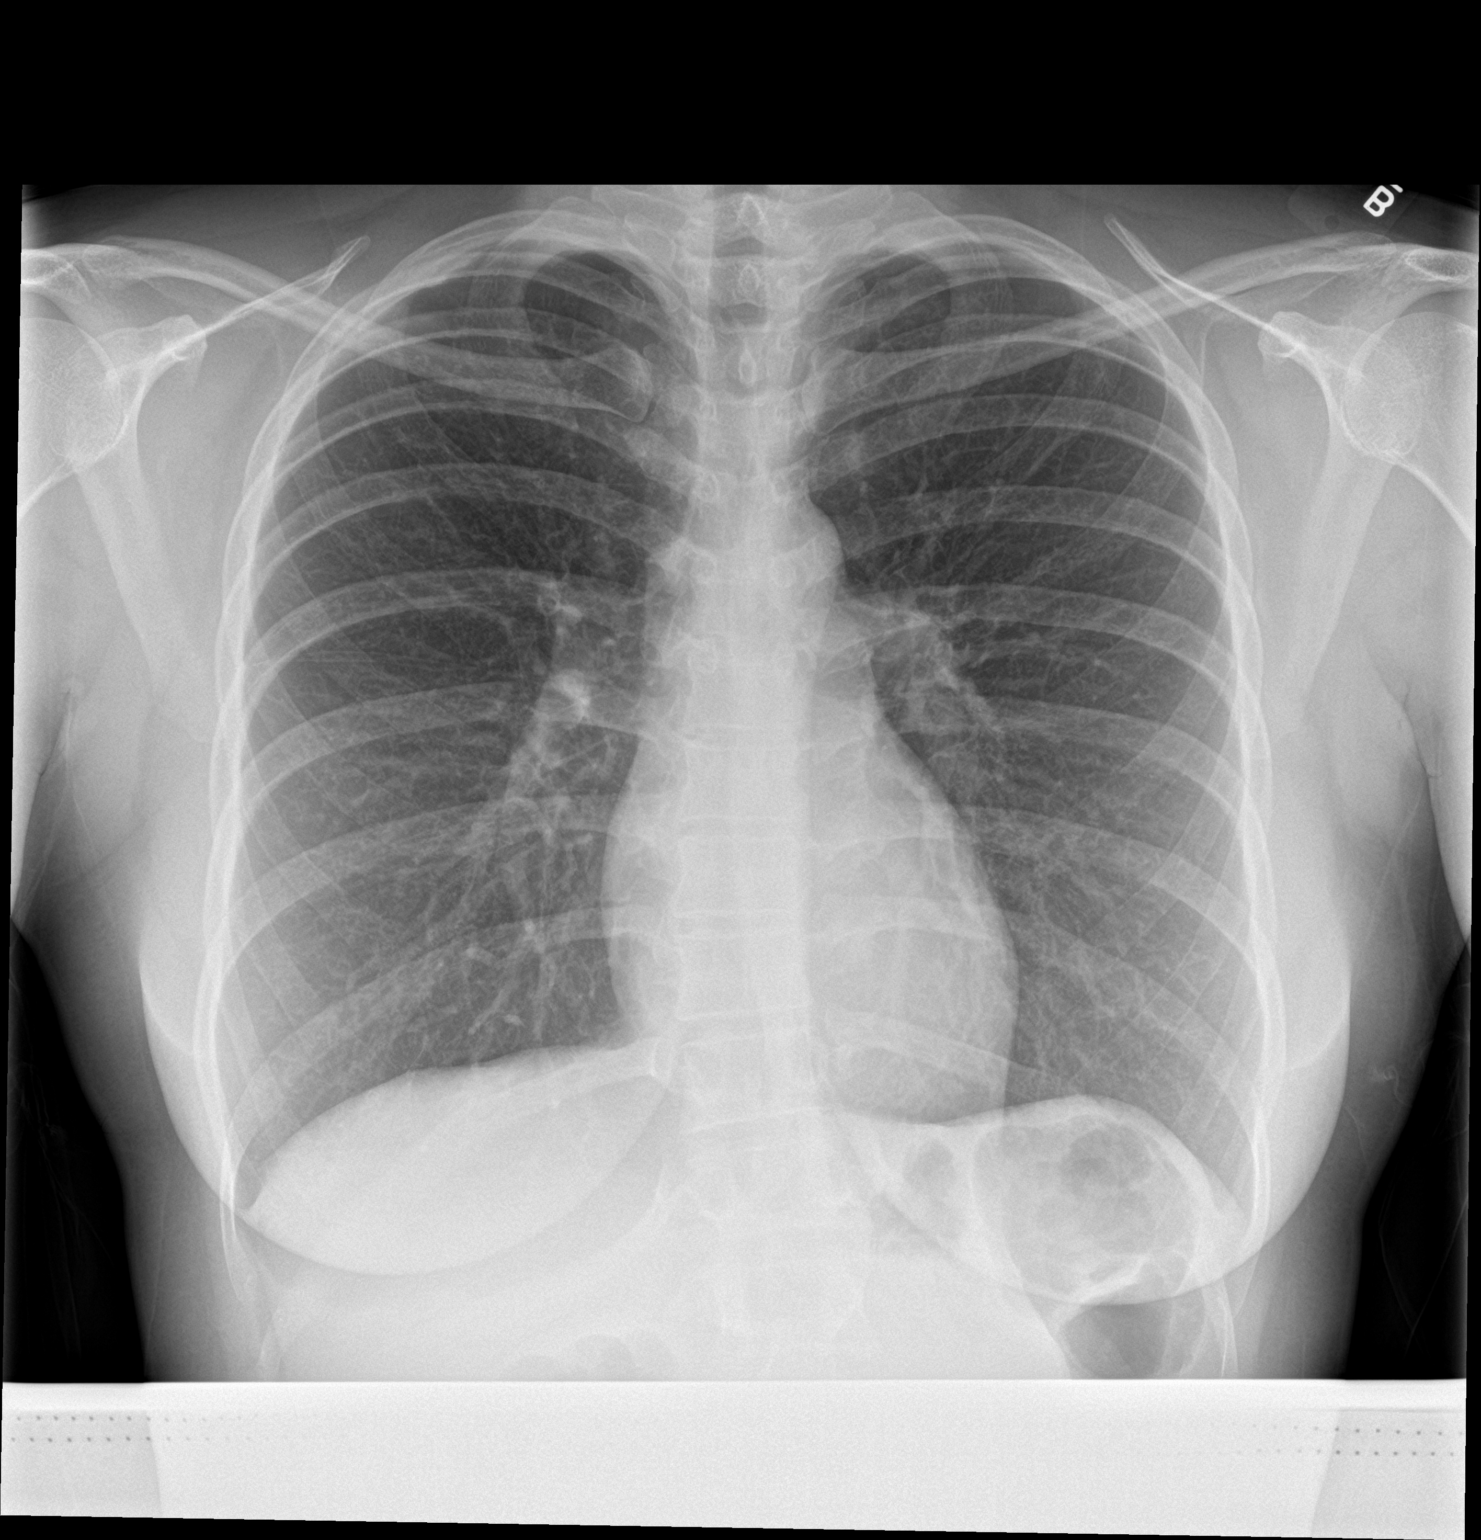

[chest lat]
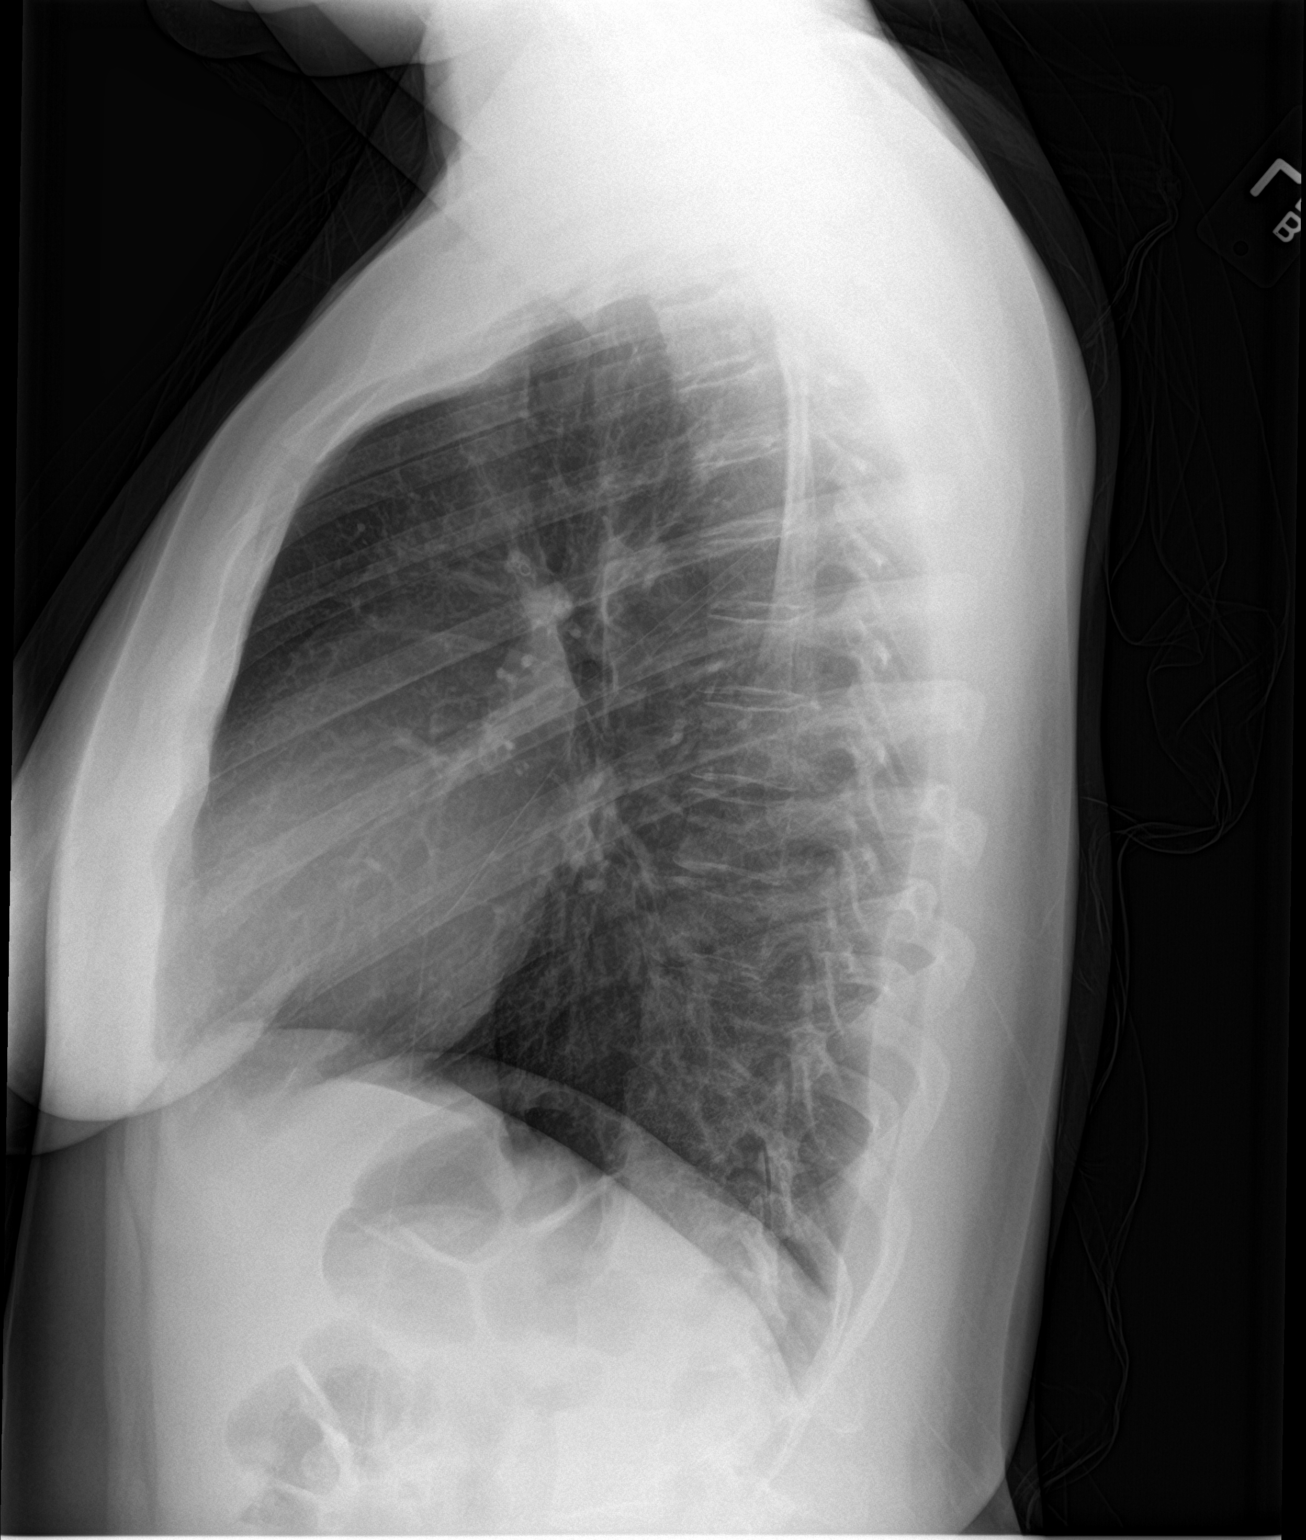

[2 of 2 positions shown; findings below may reference images not displayed]

FINDINGS: Normal heart size and mediastinal contours. No acute infiltrate or
edema. No effusion or pneumothorax. No acute osseous findings.
IMPRESSION: Negative chest.

## 2017-09-01 ENCOUNTER — Inpatient Hospital Stay
Admission: EM | Admit: 2017-09-01 | Discharge: 2017-09-04 | DRG: 798 | Disposition: A | Discharge status: Home / Self Care | Payer: Managed Care, Other (non HMO) | Attending: Obstetrics and Gynecology | Admitting: Obstetrics and Gynecology

## 2017-09-01 ENCOUNTER — Encounter: Payer: Self-pay | Admitting: *Deleted

## 2017-09-01 DIAGNOSIS — Z3A38 38 weeks gestation of pregnancy: Secondary | ICD-10-CM

## 2017-09-01 DIAGNOSIS — Z302 Encounter for sterilization: Secondary | ICD-10-CM

## 2017-09-01 DIAGNOSIS — O99824 Streptococcus B carrier state complicating childbirth: Secondary | ICD-10-CM | POA: Diagnosis present

## 2017-09-01 DIAGNOSIS — O9902 Anemia complicating childbirth: Secondary | ICD-10-CM | POA: Diagnosis present

## 2017-09-01 DIAGNOSIS — O26893 Other specified pregnancy related conditions, third trimester: Secondary | ICD-10-CM | POA: Diagnosis present

## 2017-09-01 DIAGNOSIS — D649 Anemia, unspecified: Secondary | ICD-10-CM | POA: Diagnosis present

## 2017-09-01 DIAGNOSIS — O1414 Severe pre-eclampsia complicating childbirth: Principal | ICD-10-CM | POA: Diagnosis present

## 2017-09-01 DIAGNOSIS — O24420 Gestational diabetes mellitus in childbirth, diet controlled: Secondary | ICD-10-CM | POA: Diagnosis present

## 2017-09-01 DIAGNOSIS — Z87891 Personal history of nicotine dependence: Secondary | ICD-10-CM

## 2017-09-01 DIAGNOSIS — O479 False labor, unspecified: Secondary | ICD-10-CM | POA: Diagnosis present

## 2017-09-01 DIAGNOSIS — O1413 Severe pre-eclampsia, third trimester: Secondary | ICD-10-CM | POA: Diagnosis present

## 2017-09-01 HISTORY — DX: Type 2 diabetes mellitus without complications: E11.9

## 2017-09-01 LAB — CBC
HCT: 31.3 % — ABNORMAL LOW (ref 35.0–47.0)
Hemoglobin: 10.3 g/dL — ABNORMAL LOW (ref 12.0–16.0)
MCH: 27 pg (ref 26.0–34.0)
MCHC: 32.9 g/dL (ref 32.0–36.0)
MCV: 81.9 fL (ref 80.0–100.0)
PLATELETS: 218 10*3/uL (ref 150–440)
RBC: 3.82 MIL/uL (ref 3.80–5.20)
RDW: 14.4 % (ref 11.5–14.5)
WBC: 12.4 10*3/uL — AB (ref 3.6–11.0)

## 2017-09-01 MED ORDER — LACTATED RINGERS IV SOLN
2.0000 g/h | INTRAVENOUS | Status: DC
  Administered 2017-09-02 (×2): 2 g/h via INTRAVENOUS
  Filled 2017-09-01 (×2): qty 80

## 2017-09-01 MED ORDER — HYDRALAZINE HCL 20 MG/ML IJ SOLN
10.0000 mg | Freq: Once | INTRAMUSCULAR | Status: DC | PRN
  Filled 2017-09-01: qty 0.5

## 2017-09-01 MED ORDER — LIDOCAINE HCL (PF) 1 % IJ SOLN: 30.0000 mL | INTRAMUSCULAR | Status: DC | PRN

## 2017-09-01 MED ORDER — SOD CITRATE-CITRIC ACID 500-334 MG/5ML PO SOLN: 30.0000 mL | ORAL | Status: DC | PRN

## 2017-09-01 MED ORDER — BUTORPHANOL TARTRATE 2 MG/ML IJ SOLN
1.0000 mg | INTRAMUSCULAR | Status: DC | PRN
  Administered 2017-09-02: 1 mg via INTRAVENOUS
  Filled 2017-09-01: qty 1

## 2017-09-01 MED ORDER — LABETALOL HCL 5 MG/ML IV SOLN
20.0000 mg | INTRAVENOUS | Status: DC | PRN
Start: 2017-09-01 — End: 2017-09-04
  Administered 2017-09-02: 20 mg via INTRAVENOUS
  Filled 2017-09-01: qty 4
  Filled 2017-09-01: qty 16

## 2017-09-01 MED ORDER — CALCIUM GLUCONATE 10 % IV SOLN
INTRAVENOUS | Status: AC
  Filled 2017-09-01: qty 10

## 2017-09-01 MED ORDER — OXYTOCIN BOLUS FROM INFUSION
500.0000 mL | Freq: Once | INTRAVENOUS | Status: AC
  Administered 2017-09-02: 500 mL via INTRAVENOUS

## 2017-09-01 MED ORDER — ONDANSETRON HCL 4 MG/2ML IJ SOLN: 4.0000 mg | Freq: Four times a day (QID) | INTRAMUSCULAR | Status: DC | PRN

## 2017-09-01 MED ORDER — MAGNESIUM SULFATE BOLUS VIA INFUSION
4.0000 g | Freq: Once | INTRAVENOUS | Status: AC
  Administered 2017-09-02: 4 g via INTRAVENOUS
  Filled 2017-09-01: qty 500

## 2017-09-01 MED ORDER — MISOPROSTOL 200 MCG PO TABS
ORAL_TABLET | ORAL | Status: AC
  Filled 2017-09-01: qty 4

## 2017-09-01 MED ORDER — LACTATED RINGERS IV SOLN
500.0000 mL | INTRAVENOUS | Status: DC | PRN
  Administered 2017-09-01: 500 mL via INTRAVENOUS

## 2017-09-01 MED ORDER — LIDOCAINE HCL (PF) 1 % IJ SOLN
INTRAMUSCULAR | Status: AC
  Filled 2017-09-01: qty 30

## 2017-09-01 MED ORDER — OXYTOCIN 40 UNITS IN LACTATED RINGERS INFUSION - SIMPLE MED
2.5000 [IU]/h | INTRAVENOUS | Status: DC
  Filled 2017-09-01: qty 1000

## 2017-09-01 MED ORDER — OXYTOCIN 10 UNIT/ML IJ SOLN
INTRAMUSCULAR | Status: AC
  Filled 2017-09-01: qty 2

## 2017-09-01 MED ORDER — ACETAMINOPHEN 325 MG PO TABS: 650.0000 mg | ORAL_TABLET | ORAL | Status: DC | PRN

## 2017-09-01 MED ORDER — AMMONIA AROMATIC IN INHA
RESPIRATORY_TRACT | Status: AC
  Filled 2017-09-01: qty 10

## 2017-09-01 MED ORDER — SODIUM CHLORIDE 0.9 % IV SOLN
2.0000 g | Freq: Once | INTRAVENOUS | Status: AC
  Administered 2017-09-02: 2 g via INTRAVENOUS
  Filled 2017-09-01: qty 2000

## 2017-09-01 MED ORDER — LACTATED RINGERS IV SOLN
INTRAVENOUS | Status: DC
  Administered 2017-09-01: 125 mL/h via INTRAVENOUS
  Administered 2017-09-02 – 2017-09-03 (×3): via INTRAVENOUS

## 2017-09-01 NOTE — H&P (Signed)
OB ADMISSION/ HISTORY & PHYSICAL:  Admission Date: 09/01/2017 10:21 PM  Admit Diagnosis: Contractions  Maria Reynolds is a 36 y.o. female presenting for concern for labor, found to have severe range BP on admission.  Prenatal History: [id number]   EDC : 09/10/2017, Date entered prior to episode creation  Prenatal care at [place], then [facility], and planned to deliver at [facility], but brought here because concerned she couldn't make it to [facility] Prenatal course complicated by: - Hx of severe PreE requiring early delivery- NSVD at 36wks for 4# baby in 2011 - Hx of stillbirth at approx 26weeks, induction for multiple anomalies and severe PreE.  VD in 2004 - No 17-P during this pregnancy - gDMA1: 117 on admit - Desires BTL, signed consent on 07/06/17 per records - Anemia: On gummy multivitamins - Anatomy scan normal at [facility]  Prenatal Labs: ABO, Rh: --/--/O POS (10/03 2329) O Pos Antibody: NEG (10/03 2329)neg Rubella: negative  RPR:   pending HBsAg:  neg  HIV:   neg GBS:   postitve  Medical / Surgical History :  Past medical history:  Past Medical History:  Diagnosis Date  . Diabetes mellitus without complication (HCC)    Gestational  . Intermenstrual spotting due to IUD (HCC) 06/28/2014  . IUD (intrauterine device) in place 06/28/2014  . Preterm labor   . Vaginal discharge 06/28/2014     Past surgical history:  Past Surgical History:  Procedure Laterality Date  . NO PAST SURGERIES      Family History:  Family History  Problem Relation Age of Onset  . Cancer Paternal Grandmother        lung  . Ovarian cancer Maternal Aunt      Social History:  reports that she quit smoking about 4 months ago. Her smoking use included Cigarettes. She has never used smokeless tobacco. She reports that she drinks alcohol. She reports that she does not use drugs.   Allergies: Patient has no known allergies.    Current Medications at time of admission:  Prior to Admission medications    Medication Sig Start Date End Date Taking? Authorizing Provider  Multiple Vitamin (MULTIVITAMIN) tablet Take 1 tablet by mouth daily.   Yes [provider]  HYDROcodone-homatropine (HYCODAN) 5-1.5 MG/5ML syrup Take 5 mLs by mouth every 6 (six) hours as needed for cough. Patient not taking: Reported on 05/16/2015 01/31/15   Ivery Quale, PA-C  ibuprofen (ADVIL,MOTRIN) 800 MG tablet Take 1 tablet (800 mg total) by mouth 3 (three) times daily. Patient not taking: Reported on 09/01/2017 01/31/15   Ivery Quale, PA-C  levonorgestrel (MIRENA) 20 MCG/24HR IUD 1 each by Intrauterine route once.      [provider]  loratadine-pseudoephedrine (CLARITIN-D 12 HOUR) 5-120 MG per tablet Take 1 tablet by mouth 2 (two) times daily. Patient not taking: Reported on 05/16/2015 01/31/15   Ivery Quale, PA-C  medroxyPROGESTERone (DEPO-PROVERA) 150 MG/ML injection Inject 1 mL (150 mg total) into the muscle every 3 (three) months. Patient not taking: Reported on 09/01/2017 05/16/15   Cresenzo-Dishmon, Scarlette Calico, CNM  Norgestimate-Ethinyl Estradiol Triphasic (TRI-SPRINTEC) 0.18/0.215/0.25 MG-35 MCG tablet Take 1 tablet by mouth daily. Patient not taking: Reported on 09/01/2017 05/16/15   Cresenzo-Dishmon, Scarlette Calico, CNM  tinidazole [facility]) 500 MG tablet Take 4 today and 4 in am Patient not taking: Reported on 05/16/2015 06/28/14   Adline Potter, NP     Review of Systems: Active FM onset of ctxcurrently every 2-5 minutes   Physical Exam:  VS: Blood pressure (!) 151/88, pulse (!) 101, temperature 98.6 F (37 C), temperature source Oral, resp. rate 18, height  (1.626 m), weight 79.8 kg (176 lb), last menstrual period 12/12/2016, SpO2 99 %.  General: alert and oriented, appears nad Heart: RRR Lungs: Clear lung fields Abdomen: Gravid, soft and non-tender, non-distended / uterus: non tender Extremities: 2+ edema  Genitalia / VE: Dilation: 3 Effacement (%): 60 Station: -2 Exam by:: K  Veal, RN  FHR: baseline rate 155 / variability mod / accelerations + / + late decelerations x2 now resolved TOCO: q2-5  Last [place]  normal anatomy  Assessment: 38+[redacted] weeks gestation 1 stage of labor FHR category 1   Plan:  Admit for active labor Labs pending Epidural when desired Continuous fetal monitoring   1. Fetal Well being  - Fetal Tracing: Cat II - Ultrasound:  reviewed, as above - Group B Streptococcus: pos - Presentation: vtx confirmed by leopolds and sutures   2. Routine OB: - Prenatal labs reviewed, as above - Rh O pos  3. Induction of Labor:  -  Contractions external toco in place -  Pelvis proven to 4# -  Plan for induction with pitocin to augment  4. PreEclampsia - severe range by BP - mag 4/2 - IV meds as needed  4. Post Partum Planning: - Contraception: ?BTL

## 2017-09-01 NOTE — OB Triage Note (Signed)
Pt arrived to L&D with c/o contractions every 10 mins. Pain 7/10 with contractions. Pt denies vaginal bleeding, leaking of fluid, or decreased fetal movement. Toco and EFM monitors applied. Will monitor closely.

## 2017-09-02 ENCOUNTER — Inpatient Hospital Stay: Payer: Managed Care, Other (non HMO) | Admitting: Anesthesiology

## 2017-09-02 ENCOUNTER — Encounter: Payer: Self-pay | Admitting: *Deleted

## 2017-09-02 LAB — CHLAMYDIA/NGC RT PCR (ARMC ONLY)
CHLAMYDIA TR: NOT DETECTED
N gonorrhoeae: NOT DETECTED

## 2017-09-02 LAB — COMPREHENSIVE METABOLIC PANEL
ALT: 13 U/L — AB (ref 14–54)
ANION GAP: 7 (ref 5–15)
AST: 20 U/L (ref 15–41)
Albumin: 2.8 g/dL — ABNORMAL LOW (ref 3.5–5.0)
Alkaline Phosphatase: 116 U/L (ref 38–126)
BUN: 9 mg/dL (ref 6–20)
CHLORIDE: 106 mmol/L (ref 101–111)
CO2: 22 mmol/L (ref 22–32)
CREATININE: 0.69 mg/dL (ref 0.44–1.00)
Calcium: 8.9 mg/dL (ref 8.9–10.3)
Glucose, Bld: 117 mg/dL — ABNORMAL HIGH (ref 65–99)
POTASSIUM: 3.4 mmol/L — AB (ref 3.5–5.1)
Sodium: 135 mmol/L (ref 135–145)
Total Bilirubin: 0.5 mg/dL (ref 0.3–1.2)
Total Protein: 6.7 g/dL (ref 6.5–8.1)

## 2017-09-02 LAB — TYPE AND SCREEN
ABO/RH(D): O POS
ANTIBODY SCREEN: NEGATIVE

## 2017-09-02 LAB — PROTEIN / CREATININE RATIO, URINE
CREATININE, URINE: 176 mg/dL
Protein Creatinine Ratio: 0.13 mg/mg{Cre} (ref 0.00–0.15)
TOTAL PROTEIN, URINE: 22 mg/dL

## 2017-09-02 LAB — RAPID HIV SCREEN (HIV 1/2 AB+AG)
HIV 1/2 Antibodies: NONREACTIVE
HIV-1 P24 Antigen - HIV24: NONREACTIVE

## 2017-09-02 LAB — GLUCOSE, CAPILLARY: GLUCOSE-CAPILLARY: 122 mg/dL — AB (ref 65–99)

## 2017-09-02 MED ORDER — TERBUTALINE SULFATE 1 MG/ML IJ SOLN: 0.2500 mg | Freq: Once | INTRAMUSCULAR | Status: DC | PRN

## 2017-09-02 MED ORDER — FLEET ENEMA 7-19 GM/118ML RE ENEM: 1.0000 | ENEMA | Freq: Every day | RECTAL | Status: DC | PRN

## 2017-09-02 MED ORDER — SENNOSIDES-DOCUSATE SODIUM 8.6-50 MG PO TABS
2.0000 | ORAL_TABLET | ORAL | Status: DC
  Administered 2017-09-04: 2 via ORAL
  Filled 2017-09-02: qty 2

## 2017-09-02 MED ORDER — OXYCODONE HCL 5 MG PO TABS
5.0000 mg | ORAL_TABLET | ORAL | Status: DC | PRN
  Administered 2017-09-02 (×2): 5 mg via ORAL
  Filled 2017-09-02 (×2): qty 1

## 2017-09-02 MED ORDER — ACETAMINOPHEN 325 MG PO TABS
650.0000 mg | ORAL_TABLET | ORAL | Status: DC | PRN
  Administered 2017-09-02: 650 mg via ORAL
  Filled 2017-09-02: qty 2

## 2017-09-02 MED ORDER — DIPHENHYDRAMINE HCL 25 MG PO CAPS: 25.0000 mg | ORAL_CAPSULE | Freq: Four times a day (QID) | ORAL | Status: DC | PRN

## 2017-09-02 MED ORDER — FAMOTIDINE 20 MG PO TABS: 40.0000 mg | ORAL_TABLET | Freq: Once | ORAL | Status: DC

## 2017-09-02 MED ORDER — TETANUS-DIPHTH-ACELL PERTUSSIS 5-2.5-18.5 LF-MCG/0.5 IM SUSP
0.5000 mL | Freq: Once | INTRAMUSCULAR | Status: DC
  Filled 2017-09-02: qty 0.5

## 2017-09-02 MED ORDER — SODIUM CHLORIDE 0.9 % IV SOLN
1.0000 g | INTRAVENOUS | Status: DC
  Administered 2017-09-02: 1 g via INTRAVENOUS
  Filled 2017-09-02: qty 1000

## 2017-09-02 MED ORDER — METOCLOPRAMIDE HCL 10 MG PO TABS: 10.0000 mg | ORAL_TABLET | Freq: Once | ORAL | Status: DC

## 2017-09-02 MED ORDER — DIBUCAINE 1 % RE OINT: 1.0000 "application " | TOPICAL_OINTMENT | RECTAL | Status: DC | PRN

## 2017-09-02 MED ORDER — ONDANSETRON HCL 4 MG PO TABS: 4.0000 mg | ORAL_TABLET | ORAL | Status: DC | PRN

## 2017-09-02 MED ORDER — ZOLPIDEM TARTRATE 5 MG PO TABS: 5.0000 mg | ORAL_TABLET | Freq: Every evening | ORAL | Status: DC | PRN

## 2017-09-02 MED ORDER — SODIUM CHLORIDE 0.9 % IV SOLN: 250.0000 mL | INTRAVENOUS | Status: DC | PRN

## 2017-09-02 MED ORDER — PRENATAL MULTIVITAMIN CH
1.0000 | ORAL_TABLET | Freq: Every day | ORAL | Status: DC
  Administered 2017-09-03 – 2017-09-04 (×2): 1 via ORAL
  Filled 2017-09-02 (×2): qty 1

## 2017-09-02 MED ORDER — ONDANSETRON HCL 4 MG/2ML IJ SOLN
4.0000 mg | INTRAMUSCULAR | Status: DC | PRN
  Administered 2017-09-03: 4 mg via INTRAVENOUS

## 2017-09-02 MED ORDER — BENZOCAINE-MENTHOL 20-0.5 % EX AERO: 1.0000 "application " | INHALATION_SPRAY | CUTANEOUS | Status: DC | PRN

## 2017-09-02 MED ORDER — BISACODYL 10 MG RE SUPP: 10.0000 mg | Freq: Every day | RECTAL | Status: DC | PRN

## 2017-09-02 MED ORDER — MEASLES, MUMPS & RUBELLA VAC ~~LOC~~ INJ
0.5000 mL | INJECTION | Freq: Once | SUBCUTANEOUS | Status: DC
  Filled 2017-09-02: qty 0.5

## 2017-09-02 MED ORDER — SODIUM CHLORIDE 0.9% FLUSH: 3.0000 mL | Freq: Two times a day (BID) | INTRAVENOUS | Status: DC

## 2017-09-02 MED ORDER — FENTANYL 2.5 MCG/ML W/ROPIVACAINE 0.15% IN NS 100 ML EPIDURAL (ARMC)
EPIDURAL | Status: AC
  Filled 2017-09-02: qty 100

## 2017-09-02 MED ORDER — OXYTOCIN 40 UNITS IN LACTATED RINGERS INFUSION - SIMPLE MED
1.0000 m[IU]/min | INTRAVENOUS | Status: DC
  Administered 2017-09-02: 1 m[IU]/min via INTRAVENOUS

## 2017-09-02 MED ORDER — WITCH HAZEL-GLYCERIN EX PADS: 1.0000 "application " | MEDICATED_PAD | CUTANEOUS | Status: DC | PRN

## 2017-09-02 MED ORDER — BENZOCAINE-MENTHOL 20-0.5 % EX AERO
INHALATION_SPRAY | CUTANEOUS | Status: AC
  Administered 2017-09-02: 08:00:00
  Filled 2017-09-02: qty 56

## 2017-09-02 MED ORDER — SIMETHICONE 80 MG PO CHEW: 80.0000 mg | CHEWABLE_TABLET | ORAL | Status: DC | PRN

## 2017-09-02 MED ORDER — COCONUT OIL OIL: 1.0000 "application " | TOPICAL_OIL | Status: DC | PRN

## 2017-09-02 MED ORDER — SODIUM CHLORIDE 0.9% FLUSH: 3.0000 mL | INTRAVENOUS | Status: DC | PRN

## 2017-09-02 MED ORDER — IBUPROFEN 400 MG PO TABS
600.0000 mg | ORAL_TABLET | Freq: Four times a day (QID) | ORAL | Status: DC
  Administered 2017-09-02 – 2017-09-04 (×8): 600 mg via ORAL
  Filled 2017-09-02 (×8): qty 1

## 2017-09-02 MED ORDER — LACTATED RINGERS IV SOLN: INTRAVENOUS | Status: DC

## 2017-09-02 NOTE — Anesthesia Procedure Notes (Signed)
Epidural Patient location during procedure: OB  Staffing Performed: anesthesiologist   Preanesthetic Checklist Completed: patient identified, site marked, surgical consent, pre-op evaluation, timeout performed, IV checked, risks and benefits discussed and monitors and equipment checked  Epidural Patient position: sitting Prep: Betadine Patient monitoring: heart rate, continuous pulse ox and blood pressure Approach: midline Location: L4-L5 Injection technique: LOR saline  Needle:  Needle type: Tuohy  Needle gauge: 17 G Needle length: 9 cm and 9 Needle insertion depth: 7 and 0 cm Catheter type: closed end flexible Catheter size: 20 Guage Test dose: negative and 1.5% lidocaine with Epi 1:200 K  Assessment Sensory level: T10 (na) Events: blood not aspirated, injection not painful, no injection resistance, negative IV test and no paresthesia  Additional Notes As above with multiple efforts as passage without success.  Never able to achieve ligament despite multiple efforts at leftward placement.  Positive lamina at 3 levels.  Decision to abort for safety reasons.  JA  Reason for block:procedure for pain

## 2017-09-02 NOTE — Anesthesia Preprocedure Evaluation (Signed)
Anesthesia Evaluation  Patient identified by MRN, date of birth, ID band Patient awake    Reviewed: Allergy & Precautions, H&P , NPO status , Patient's Chart, lab work & pertinent test results, reviewed documented beta blocker date and time   Airway Mallampati: II  TM Distance: >3 FB Neck ROM: full    Dental no notable dental hx. (+) Teeth Intact   Pulmonary neg pulmonary ROS, Current Smoker, former smoker,    Pulmonary exam normal breath sounds clear to auscultation       Cardiovascular Exercise Tolerance: Good hypertension, On Medications negative cardio ROS   Rhythm:regular Rate:Normal     Neuro/Psych negative neurological ROS  negative psych ROS   GI/Hepatic negative GI ROS, Neg liver ROS,   Endo/Other  negative endocrine ROSdiabetes  Renal/GU      Musculoskeletal   Abdominal   Peds  Hematology negative hematology ROS (+)   Anesthesia Other Findings Unable to feel spinous processes in lumber region.  Pt reports very difficult placement of [facility] at [facility] in past. Required five sticks and then a marginal quality epidural. JA  Reproductive/Obstetrics (+) Pregnancy                             Anesthesia Physical Anesthesia Plan  ASA: II  Anesthesia Plan: Epidural   Post-op Pain Management:    Induction:   PONV Risk Score and Plan:   Airway Management Planned:   Additional Equipment:   Intra-op Plan:   Post-operative Plan:   Informed Consent: I have reviewed the patients History and Physical, chart, labs and discussed the procedure including the risks, benefits and alternatives for the proposed anesthesia with the patient or authorized representative who has indicated his/her understanding and acceptance.     Plan Discussed with:   Anesthesia Plan Comments:         Anesthesia Quick Evaluation

## 2017-09-02 NOTE — Progress Notes (Signed)
Dr Dalbert Garnet present at bedside. MD reviewed POC with pt. Assessment done per MD. MD reviewed FHR tracing and CTX pattern.

## 2017-09-02 NOTE — Progress Notes (Signed)
MD Adams at bedside for epidural attempt. Unable to place. Pt educated regarding pain management options. Will continue to monitor closely.

## 2017-09-02 NOTE — Discharge Summary (Signed)
Obstetrical Discharge Summary  Patient Name: Maria Reynolds DOB: 05-07-1981 MRN: [id number]  Date of Admission: 09/01/2017 Date of Discharge: 09/04/17 [facility] OB: [place]  Gestational Age at Delivery: [redacted]w[redacted]d   Antepartum complications:  - Hx of severe PreE requiring early delivery- NSVD at 49wks for 4# baby in 2011 - Hx of stillbirth at approx 26weeks, induction for multiple anomalies and severe PreE.  VD in 2004 - No 17-P during this pregnancy - gDMA1: 117 on admit - Desires BTL, signed consent on 07/06/17 per records - Anemia: On gummy multivitamins - Anatomy scan normal at [facility]  Admitting Diagnosis: PreEclampsia with severe features Secondary Diagnosis:contractions at term Patient Active Problem List   Diagnosis Date Noted  . Hypertension in pregnancy, preeclampsia, severe, antepartum, third trimester 09/01/2017  . Encounter for IUD removal 05/16/2015  . Intermenstrual spotting due to IUD (HCC) 06/28/2014  . Vaginal discharge 06/28/2014  . BV (bacterial vaginosis) 06/28/2014  . Eczema 07/24/2013    Augmentation: Pitocin Complications: None Intrapartum complications/course:   36yo G3P0201 at 38+6wks admitted with contractions but found to have severe range BP. Mag started, and 1 dose of iv labetalol given during pushing for severe pressures. PreE labs wnl. She progressed to fully dilated and began to push over an intact perineum. She wanted an epidural but was unable to get one due to spine curvature. She pushed out a baby well and he was placed on maternal abdomen. Her bleeding was minimal, but placenta delivered later with expression and trailing membranes that were teased out. A single manual evaluation of the LUS revealed a large clot, but her fundus was firm and no bleeding noted. No tears.  Baby did have retractions and nostril flaring after delivery.  Date of Delivery: 09/02/17 Delivered By: Christeen Douglas Delivery Type: spontaneous vaginal  delivery Anesthesia: none Placenta: Expressed Laceration: None Episiotomy: none Newborn Data: Live born female "Tymir" Birth Weight: 5 lb 15.2 oz (2700 g) APGAR: 7, 8  Newborn Delivery   Birth date/time:  09/02/2017 07:04:00 Delivery type:  Vaginal, Spontaneous Delivery        Discharge Physical Exam: 09/04/2017  BP 130/70 (BP Location: Right Arm)   Pulse 66   Temp 98.9 F (37.2 C) (Oral)   Resp 18   Ht  (1.626 m)   Wt 81.5 kg (179 lb 9.6 oz)   LMP 12/12/2016 (Approximate)   SpO2 99%   Breastfeeding? Unknown   BMI 30.83 kg/m   General: NAD CV: RRR Pulm: CTABL, nl effort ABD: s/nd/nt, fundus firm and below the umbilicus Lochia: moderate Incision: c/d/i  DVT Evaluation: LE non-ttp, no evidence of DVT on exam.  Hemoglobin  Date Value Ref Range Status  09/03/2017 9.1 (L) 12.0 - 16.0 g/dL Final   HGB  Date Value Ref Range Status  10/17/2012 12.5 12.0 - 16.0 g/dL Final   HCT  Date Value Ref Range Status  09/03/2017 27.6 (L) 35.0 - 47.0 % Final  10/17/2012 38.1 35.0 - 47.0 % Final    Post partum course: 24hr magnesium sulfate for seizure ppx, then to OR for PPTL, mild-range pressures post partum/postop.. On DOD . No h/a or vision change .VSS.  Postpartum Procedures: PPTL Disposition: stable, discharge to home. Baby Feeding: formula Baby Disposition: nursery  Rh Immune globulin given: n/a Rubella vaccine given: n/a Tdap vaccine given in AP or PP setting:  Flu vaccine given in AP or PP setting:   Contraception: BTL  Prenatal Labs:   ABO, Rh: --/--/O POS (  10/03 2329) O Pos Antibody: NEG (10/03 2329)neg Rubella: negative  RPR:   pending HBsAg:  neg  HIV:   neg GBS:   postitve   Plan:  ROSANN GORUM was discharged to home in good condition. Follow-up appointment at [facility] in 2 weeks and [facility] dept in 6 weeks   F/U with surgeon for postop check in 2 weeks   Discharge Medications: Allergies as of 09/04/2017   No  Known Allergies     Medication List    STOP taking these medications   HYDROcodone-homatropine 5-1.5 MG/5ML syrup Commonly known as:  HYCODAN   levonorgestrel 20 MCG/24HR IUD Commonly known as:  MIRENA   medroxyPROGESTERone 150 MG/ML injection Commonly known as:  DEPO-PROVERA   Norgestimate-Ethinyl Estradiol Triphasic 0.18/0.215/0.25 MG-35 MCG tablet Commonly known as:  TRI-SPRINTEC   tinidazole 500 MG tablet Commonly known as:  TINDAMAX     TAKE these medications   docusate sodium 100 MG capsule Commonly known as:  COLACE Take 1 capsule (100 mg total) by mouth daily as needed.   ibuprofen 600 MG tablet Commonly known as:  ADVIL,MOTRIN Take 1 tablet (600 mg total) by mouth every 6 (six) hours. What changed:  medication strength  how much to take  when to take this   loratadine-pseudoephedrine 5-120 MG tablet Commonly known as:  CLARITIN-D 12 HOUR Take 1 tablet by mouth 2 (two) times daily.   multivitamin tablet Take 1 tablet by mouth daily.   ondansetron 4 MG tablet Commonly known as:  ZOFRAN Take 1 tablet (4 mg total) by mouth every 4 (four) hours as needed for nausea.   oxyCODONE 5 MG immediate release tablet Commonly known as:  Oxy IR/ROXICODONE Take 1 tablet (5 mg total) by mouth every 4 (four) hours as needed for moderate pain.       Follow-up Information    Ward, Elenora Fender, MD Follow up in 2 week(s).   Specialty:  Obstetrics and Gynecology Why:  post op  Contact information: [address] [facility] [place] [address] [phone]           Signed: Jennell Corner MD

## 2017-09-02 NOTE — Progress Notes (Signed)
Spoke with MD Leafy Ro via phone regarding pt's BP. Plan made per MD to administered magnesium infusion. Monitor BP's and administer antihypertensive PRNs if order parameters are met. Will hold labetalol and hydralazine at this time.

## 2017-09-02 NOTE — Progress Notes (Signed)
Pt transferred from obs 4 to LDR 6 via wheelchair for admission

## 2017-09-03 ENCOUNTER — Inpatient Hospital Stay: Payer: Managed Care, Other (non HMO) | Admitting: Certified Registered"

## 2017-09-03 ENCOUNTER — Encounter
Admission: EM | Disposition: A | Discharge status: Home / Self Care | Payer: Self-pay | Source: Home / Self Care | Attending: Obstetrics and Gynecology

## 2017-09-03 ENCOUNTER — Encounter: Payer: Self-pay | Admitting: Anesthesiology

## 2017-09-03 HISTORY — PX: TUBAL LIGATION: SHX77

## 2017-09-03 LAB — CBC
HEMATOCRIT: 27.6 % — AB (ref 35.0–47.0)
HEMOGLOBIN: 9.1 g/dL — AB (ref 12.0–16.0)
MCH: 27.8 pg (ref 26.0–34.0)
MCHC: 33.1 g/dL (ref 32.0–36.0)
MCV: 84 fL (ref 80.0–100.0)
Platelets: 191 10*3/uL (ref 150–440)
RBC: 3.28 MIL/uL — ABNORMAL LOW (ref 3.80–5.20)
RDW: 14.6 % — ABNORMAL HIGH (ref 11.5–14.5)
WBC: 12.7 10*3/uL — AB (ref 3.6–11.0)

## 2017-09-03 LAB — GLUCOSE, CAPILLARY
GLUCOSE-CAPILLARY: 88 mg/dL (ref 65–99)
Glucose-Capillary: 83 mg/dL (ref 65–99)
Glucose-Capillary: 92 mg/dL (ref 65–99)

## 2017-09-03 LAB — RPR: RPR: NONREACTIVE

## 2017-09-03 SURGERY — LIGATION, FALLOPIAN TUBE, POSTPARTUM
Anesthesia: General

## 2017-09-03 MED ORDER — MIDAZOLAM HCL 2 MG/2ML IJ SOLN
INTRAMUSCULAR | Status: DC | PRN
  Administered 2017-09-03: 2 mg via INTRAVENOUS

## 2017-09-03 MED ORDER — SUGAMMADEX SODIUM 200 MG/2ML IV SOLN
INTRAVENOUS | Status: DC | PRN
  Administered 2017-09-03: 160 mg via INTRAVENOUS

## 2017-09-03 MED ORDER — ROCURONIUM BROMIDE 100 MG/10ML IV SOLN
INTRAVENOUS | Status: DC | PRN
  Administered 2017-09-03: 10 mg via INTRAVENOUS

## 2017-09-03 MED ORDER — SUCCINYLCHOLINE CHLORIDE 20 MG/ML IJ SOLN
INTRAMUSCULAR | Status: DC | PRN
  Administered 2017-09-03: 100 mg via INTRAVENOUS

## 2017-09-03 MED ORDER — FENTANYL CITRATE (PF) 100 MCG/2ML IJ SOLN
INTRAMUSCULAR | Status: AC
  Administered 2017-09-03: 25 ug via INTRAVENOUS
  Filled 2017-09-03: qty 2

## 2017-09-03 MED ORDER — LACTATED RINGERS IV SOLN
INTRAVENOUS | Status: DC | PRN
  Administered 2017-09-03: 09:00:00 via INTRAVENOUS

## 2017-09-03 MED ORDER — SOD CITRATE-CITRIC ACID 500-334 MG/5ML PO SOLN
ORAL | Status: AC
  Administered 2017-09-03: 30 mL
  Filled 2017-09-03: qty 15

## 2017-09-03 MED ORDER — BUPIVACAINE HCL (PF) 0.5 % IJ SOLN
INTRAMUSCULAR | Status: AC
  Filled 2017-09-03: qty 30

## 2017-09-03 MED ORDER — ESMOLOL HCL 100 MG/10ML IV SOLN
INTRAVENOUS | Status: DC | PRN
  Administered 2017-09-03: 20 mg via INTRAVENOUS

## 2017-09-03 MED ORDER — FENTANYL CITRATE (PF) 100 MCG/2ML IJ SOLN
25.0000 ug | INTRAMUSCULAR | Status: DC | PRN
  Administered 2017-09-03 (×3): 25 ug via INTRAVENOUS

## 2017-09-03 MED ORDER — PROPOFOL 10 MG/ML IV BOLUS
INTRAVENOUS | Status: AC
  Filled 2017-09-03: qty 20

## 2017-09-03 MED ORDER — FENTANYL CITRATE (PF) 100 MCG/2ML IJ SOLN
INTRAMUSCULAR | Status: DC | PRN
  Administered 2017-09-03 (×2): 50 ug via INTRAVENOUS

## 2017-09-03 MED ORDER — BUPIVACAINE HCL 0.5 % IJ SOLN
INTRAMUSCULAR | Status: DC | PRN
  Administered 2017-09-03: 6 mL

## 2017-09-03 MED ORDER — PROPOFOL 10 MG/ML IV BOLUS
INTRAVENOUS | Status: DC | PRN
  Administered 2017-09-03: 200 mg via INTRAVENOUS

## 2017-09-03 MED ORDER — ONDANSETRON HCL 4 MG/2ML IJ SOLN: 4.0000 mg | Freq: Once | INTRAMUSCULAR | Status: DC | PRN

## 2017-09-03 MED ORDER — DEXAMETHASONE SODIUM PHOSPHATE 10 MG/ML IJ SOLN
INTRAMUSCULAR | Status: DC | PRN
Start: 2017-09-03 — End: 2017-09-03
  Administered 2017-09-03: 10 mg via INTRAVENOUS

## 2017-09-03 MED ORDER — ACETAMINOPHEN 500 MG PO TABS: 1000.0000 mg | ORAL_TABLET | Freq: Four times a day (QID) | ORAL | Status: DC | PRN

## 2017-09-03 MED ORDER — OXYCODONE HCL 5 MG PO TABS
5.0000 mg | ORAL_TABLET | ORAL | Status: DC | PRN
  Filled 2017-09-03: qty 1

## 2017-09-03 MED ORDER — OXYCODONE HCL 5 MG PO TABS
10.0000 mg | ORAL_TABLET | ORAL | Status: DC | PRN
  Administered 2017-09-03 (×2): 10 mg via ORAL
  Filled 2017-09-03 (×3): qty 2

## 2017-09-03 MED ORDER — LIDOCAINE HCL (CARDIAC) 20 MG/ML IV SOLN
INTRAVENOUS | Status: DC | PRN
  Administered 2017-09-03: 100 mg via INTRAVENOUS

## 2017-09-03 MED ORDER — PHENYLEPHRINE HCL 10 MG/ML IJ SOLN
INTRAMUSCULAR | Status: DC | PRN
  Administered 2017-09-03 (×2): 100 ug via INTRAVENOUS

## 2017-09-03 MED ORDER — FENTANYL CITRATE (PF) 100 MCG/2ML IJ SOLN
INTRAMUSCULAR | Status: AC
  Filled 2017-09-03: qty 2

## 2017-09-03 MED ORDER — MIDAZOLAM HCL 2 MG/2ML IJ SOLN
INTRAMUSCULAR | Status: AC
  Filled 2017-09-03: qty 2

## 2017-09-03 SURGICAL SUPPLY — 32 items
ADH SKN CLS APL DERMABOND .7 (GAUZE/BANDAGES/DRESSINGS) ×1
BLADE SURG SZ11 CARB STEEL (BLADE) ×2 IMPLANT
CATH TRAY METER 16FR LF (MISCELLANEOUS) ×1 IMPLANT
CHLORAPREP W/TINT 26ML (MISCELLANEOUS) ×2 IMPLANT
DERMABOND ADVANCED (GAUZE/BANDAGES/DRESSINGS) ×1
DERMABOND ADVANCED .7 DNX12 (GAUZE/BANDAGES/DRESSINGS) ×1 IMPLANT
DRAPE LAPAROTOMY 100X77 ABD (DRAPES) ×2 IMPLANT
DRSG TEGADERM 2-3/8X2-3/4 SM (GAUZE/BANDAGES/DRESSINGS) ×2 IMPLANT
DRSG TELFA 4X3 1S NADH ST (GAUZE/BANDAGES/DRESSINGS) ×2 IMPLANT
ELECT CAUTERY BLADE 6.4 (BLADE) ×2 IMPLANT
ELECT REM PT RETURN 9FT ADLT (ELECTROSURGICAL) ×2
ELECTRODE REM PT RTRN 9FT ADLT (ELECTROSURGICAL) ×1 IMPLANT
GLOVE PI ORTHOPRO 6.5 (GLOVE) ×1
GLOVE PI ORTHOPRO STRL 6.5 (GLOVE) ×1 IMPLANT
GLOVE SURG SYN 6.5 ES PF (GLOVE) ×2 IMPLANT
GLOVE SURG SYN 6.5 PF PI (GLOVE) ×1 IMPLANT
GOWN STRL REUS W/ TWL LRG LVL3 (GOWN DISPOSABLE) ×2 IMPLANT
GOWN STRL REUS W/TWL LRG LVL3 (GOWN DISPOSABLE) ×4
KIT RM TURNOVER CYSTO AR (KITS) ×2 IMPLANT
LABEL OR SOLS (LABEL) ×2 IMPLANT
LIGASURE BLUNT 5MM 37CM (INSTRUMENTS) IMPLANT
NDL SAFETY 22GX1.5 (NEEDLE) ×2 IMPLANT
NS IRRIG 500ML POUR BTL (IV SOLUTION) ×2 IMPLANT
PACK BASIN MINOR ARMC (MISCELLANEOUS) ×2 IMPLANT
RETRACTOR WOUND ALXS 18CM SML (MISCELLANEOUS) ×1 IMPLANT
RTRCTR WOUND ALEXIS O 18CM SML (MISCELLANEOUS) ×2
SPONGE LAP 18X18 5 PK (GAUZE/BANDAGES/DRESSINGS) ×1 IMPLANT
SUT MNCRL AB 4-0 PS2 18 (SUTURE) ×2 IMPLANT
SUT VIC AB 2-0 CT1 27 (SUTURE) ×4
SUT VIC AB 2-0 CT1 TAPERPNT 27 (SUTURE) ×2 IMPLANT
SUT VIC AB 2-0 UR6 27 (SUTURE) ×2 IMPLANT
SYRINGE 10CC LL (SYRINGE) ×2 IMPLANT

## 2017-09-03 NOTE — Transfer of Care (Signed)
Immediate Anesthesia Transfer of Care Note  Patient: Maria Reynolds  Procedure(s) Performed: POST PARTUM TUBAL LIGATION (N/A )  Patient Location: PACU  Anesthesia Type:General  Level of Consciousness: awake  Airway & Oxygen Therapy: Patient Spontanous Breathing  Post-op Assessment: Report given to RN  Post vital signs: Reviewed  Last Vitals:  Vitals:   09/03/17 0836 09/03/17 1005  BP: (!) 149/88 131/90  Pulse: 90 94  Resp: 14 15  Temp:  36.5 C  SpO2: 100% 99%    Last Pain:  Vitals:   09/03/17 0750  TempSrc: Oral  PainSc:          Complications: No apparent anesthesia complications

## 2017-09-03 NOTE — Anesthesia Post-op Follow-up Note (Signed)
Anesthesia QCDR form completed.        

## 2017-09-03 NOTE — Anesthesia Postprocedure Evaluation (Signed)
Anesthesia Post Note  Patient: Maria Reynolds  Procedure(s) Performed: POST PARTUM TUBAL LIGATION (N/A )  Patient location during evaluation: PACU Anesthesia Type: General Level of consciousness: awake and alert Pain management: pain level controlled Vital Signs Assessment: post-procedure vital signs reviewed and stable Respiratory status: spontaneous breathing, nonlabored ventilation, respiratory function stable and patient connected to nasal cannula oxygen Cardiovascular status: blood pressure returned to baseline and stable Postop Assessment: no apparent nausea or vomiting Anesthetic complications: no     Last Vitals:  Vitals:   09/03/17 1130 09/03/17 1151  BP: (!) 151/91 (!) 153/91  Pulse: 90   Resp: 18   Temp: 36.6 C   SpO2: 98%     Last Pain:  Vitals:   09/03/17 1130  TempSrc: Oral  PainSc:                  Cammy Sanjurjo S

## 2017-09-03 NOTE — Anesthesia Procedure Notes (Signed)
Procedure Name: Intubation Date/Time: 09/03/2017 9:01 AM Performed by: Timoteo Expose Pre-anesthesia Checklist: Patient identified, Emergency Drugs available, Suction available, Patient being monitored and Timeout performed Patient Re-evaluated:Patient Re-evaluated prior to induction Oxygen Delivery Method: Circle system utilized Preoxygenation: Pre-oxygenation with 100% oxygen Induction Type: IV induction Ventilation: Mask ventilation without difficulty Laryngoscope Size: Mac and 3 Grade View: Grade III Tube type: Oral Tube size: 7.0 mm Number of attempts: 1 Airway Equipment and Method: Stylet Placement Confirmation: ETT inserted through vocal cords under direct vision,  positive ETCO2,  breath sounds checked- equal and bilateral and CO2 detector Secured at: 21 cm Tube secured with: Tape Dental Injury: Teeth and Oropharynx as per pre-operative assessment

## 2017-09-03 NOTE — Anesthesia Preprocedure Evaluation (Signed)
Anesthesia Evaluation  Patient identified by MRN, date of birth, ID band Patient awake    Reviewed: Allergy & Precautions, NPO status , Patient's Chart, lab work & pertinent test results, reviewed documented beta blocker date and time   Airway Mallampati: III  TM Distance: >3 FB     Dental  (+) Chipped   Pulmonary former smoker,           Cardiovascular hypertension,      Neuro/Psych    GI/Hepatic   Endo/Other  diabetes, Type 2  Renal/GU      Musculoskeletal   Abdominal   Peds  Hematology   Anesthesia Other Findings   Reproductive/Obstetrics                             Anesthesia Physical Anesthesia Plan  ASA: II  Anesthesia Plan: General   Post-op Pain Management:    Induction: Intravenous  PONV Risk Score and Plan:   Airway Management Planned: Oral ETT  Additional Equipment:   Intra-op Plan:   Post-operative Plan:   Informed Consent: I have reviewed the patients History and Physical, chart, labs and discussed the procedure including the risks, benefits and alternatives for the proposed anesthesia with the patient or authorized representative who has indicated his/her understanding and acceptance.     Plan Discussed with: CRNA  Anesthesia Plan Comments:         Anesthesia Quick Evaluation

## 2017-09-03 NOTE — Progress Notes (Signed)
Called dr Maisie Fus about blood pressure of 146 /93 and blood sugar of 83

## 2017-09-03 NOTE — Progress Notes (Signed)
Peri pad with small to amt amt of blood

## 2017-09-03 NOTE — Progress Notes (Signed)
Post Partum Day 1 Subjective: Doing well, no complaints.   No CP SOB F/C N/V or leg pain no HA change of vision, RUQ/epigastric pain Still wants tubes tied   Objective: BP (!) 155/90   Pulse 85   Temp 98.1 F (36.7 C) (Oral)   Resp 18   Ht  (1.626 m)   Wt 79.8 kg (176 lb)   SpO2 100%   BMI 30.21 kg/m    Physical Exam:  General: NAD CV: RRR Pulm: nl effort, CTABL Lochia: moderate Uterine Fundus: fundus firm and below umbilicus DVT Evaluation: no cords, ttp LEs    Recent Labs  09/01/17 2329 09/03/17 0435  HGB 10.3* 9.1*  HCT 31.3* 27.6*  WBC 12.4* 12.7*  PLT 218 191    Assessment/Plan: 36 y.o. [id number] postpartum day # 1 s/p SVD with preeclampsia with severe features   1. Preeclampsia:  No s/sx worseing, s/p magnesium sulfate x 24hrs  2. Mag sulfate:  No s/sx toxicity.  D/C. 3. Postpartum: s/p svd, routine cares 4. Tubal:  To OR now, verified consent.   ----- Ranae Plumber, MD Attending Obstetrician and Gynecologist [facility]

## 2017-09-03 NOTE — Op Note (Signed)
  Post Partum Tubal Ligation  Indication for procedure: desired permanent sterilization  Pre-op diagnosis: s/p term vaginal delivery, desired permanent sterilization  Post-op diagnosis: same Procedure: post partum bilateral tubal ligation Surgeon: Chelsea Ward Assist: none Anesthesia: general  IVF: 950cc EBL: minimal UOP: 100cc Findings: patent bilateral tubes, normal post-gravid uterine fundus Specimens: portion of left tube, portion of right tube Complications: none apparent Disposition: stable to PACU  Procedure in detail: The patient was seen and confirmed desire for permanent sterilization.  She was identified as Maria Reynolds and was brought to the OR.  General anesthesia was administered, and the patient was prepped and draped in the usual sterile fashion.  A surgical time-out was called.  An 11-blade was used to incise the skin in the infraumbilical area.  The subcutaneous tissues were dissected to and then through the fascia, and the peritoneum was grasped and divided.  An alexis retractor was placed in the abdominal cavity and positioned.  The uterus was identified, and the right tube was grasped with a babcock and traced to the fimbriated end.  The mesosalpinx was undermined, kelly clamps were applied, and metzenbaums were used to transect the tube from the underlying tissue.  2-0 Vicryl was used to tie the remaining tissue.  The same steps were used on the left side.  All dissected ends were found to be hemostatic.  The alexis retractor was removed, and the fascia was reapproximated with 2-0 vicryl.  The subcutaneous tissue was irrigated and then the skin was closed with 4-0 monocryl.  The skin was covered in surgical glue.  The sponge, needle, and instrument counts were correct x2.  The patient tolerated the procedure and was brought to PACU in a stable condition.  Ranae Plumber, MD Attending Obstetrician and Gynecologist [facility]

## 2017-09-04 MED ORDER — OXYCODONE HCL 5 MG PO TABS: 5.0000 mg | ORAL_TABLET | ORAL | 0 refills | Status: DC | PRN

## 2017-09-04 MED ORDER — ONDANSETRON HCL 4 MG PO TABS: 4.0000 mg | ORAL_TABLET | ORAL | 0 refills | Status: DC | PRN

## 2017-09-04 MED ORDER — DOCUSATE SODIUM 100 MG PO CAPS: 100.0000 mg | ORAL_CAPSULE | Freq: Every day | ORAL | 2 refills | Status: AC | PRN

## 2017-09-04 MED ORDER — IBUPROFEN 600 MG PO TABS: 600.0000 mg | ORAL_TABLET | Freq: Four times a day (QID) | ORAL | 0 refills | Status: DC

## 2017-09-04 NOTE — Progress Notes (Signed)
Pt discharged with infant.  Discharge instructions, prescriptions and follow up appointment given to and reviewed with pt. Pt verbalized understanding. Escorted out by staff. 

## 2017-09-04 NOTE — Anesthesia Postprocedure Evaluation (Signed)
Anesthesia Post Note  Patient: Maria Reynolds  Procedure(s) Performed: AN AD HOC LABOR EPIDURAL  Patient location during evaluation: Mother Baby Anesthesia Type: Epidural Level of consciousness: awake and alert and oriented Vital Signs Assessment: post-procedure vital signs reviewed and stable Respiratory status: spontaneous breathing Cardiovascular status: blood pressure returned to baseline Postop Assessment: no headache Anesthetic complications: no Comments: Unable to obtain epidural.  Patient understands difficulty and voices no concerns.     Last Vitals:  Vitals:   09/04/17 0408 09/04/17 0741  BP: 104/61 130/70  Pulse: 62 66  Resp: 20 18  Temp: 37.1 C 37.2 C  SpO2: 99% 99%    Last Pain:  Vitals:   09/04/17 0820  TempSrc:   PainSc: 4                  Kimiyo Carmicheal

## 2017-09-06 LAB — SURGICAL PATHOLOGY

## 2019-02-13 ENCOUNTER — Emergency Department: Payer: Self-pay

## 2019-02-13 ENCOUNTER — Emergency Department
Admission: EM | Admit: 2019-02-13 | Discharge: 2019-02-13 | Disposition: A | Discharge status: Home / Self Care | Payer: Self-pay | Attending: Emergency Medicine | Admitting: Emergency Medicine

## 2019-02-13 ENCOUNTER — Encounter: Payer: Self-pay | Admitting: Emergency Medicine

## 2019-02-13 ENCOUNTER — Other Ambulatory Visit: Payer: Self-pay

## 2019-02-13 DIAGNOSIS — B9789 Other viral agents as the cause of diseases classified elsewhere: Secondary | ICD-10-CM

## 2019-02-13 DIAGNOSIS — J069 Acute upper respiratory infection, unspecified: Secondary | ICD-10-CM | POA: Insufficient documentation

## 2019-02-13 DIAGNOSIS — F17210 Nicotine dependence, cigarettes, uncomplicated: Secondary | ICD-10-CM | POA: Insufficient documentation

## 2019-02-13 MED ORDER — ALBUTEROL SULFATE (2.5 MG/3ML) 0.083% IN NEBU: 5.0000 mg | INHALATION_SOLUTION | Freq: Once | RESPIRATORY_TRACT | Status: DC

## 2019-02-13 MED ORDER — ALBUTEROL SULFATE HFA 108 (90 BASE) MCG/ACT IN AERS: INHALATION_SPRAY | RESPIRATORY_TRACT | 1 refills | Status: DC

## 2019-02-13 MED ORDER — IPRATROPIUM-ALBUTEROL 0.5-2.5 (3) MG/3ML IN SOLN
3.0000 mL | Freq: Once | RESPIRATORY_TRACT | Status: AC
  Administered 2019-02-13: 3 mL via RESPIRATORY_TRACT
  Filled 2019-02-13: qty 3

## 2019-02-13 NOTE — ED Triage Notes (Addendum)
Pt reports cough and shortness of breath that started Saturday evening; nonproductive cough and sneezing; denies sinus drainage; denies fever;  pt says she feels like she's getting worse; denies chest pain; pt talking in complete coherent sentences;

## 2019-02-13 NOTE — ED Provider Notes (Signed)
[facility] Emergency Department Provider Note  ____________________________________________   First MD Initiated Contact with Patient 02/13/19 0503     (approximate)  I have reviewed the triage vital signs and the nursing notes.   HISTORY  Chief Complaint Cough and Shortness of Breath    HPI Maria Reynolds is a 38 y.o. female with medical history as listed below which also includes daily tobacco use.  She presents for evaluation of gradual onset of cough and some shortness of breath particular with exertion and worse at night, developed over the last 24+ hours.  She has had no body aches, chest pain, myalgias, neck pain, neck stiffness, nausea, vomiting, nor abdominal pain.  She has had some nasal congestion and runny nose.  She has several kids in the house who have had some respiratory symptoms recently.  She works at [employer] and is concerned about COVID-19 but she has not been doing any travel nor had any contact with people who have been traveling to endemic areas or who have tested positive for COVID-19.  She does not have a history of asthma nor COPD although she has been smoking for an extended period of time.  She has no seasonal allergies of which she is aware.  She describes the symptoms as moderate in severity.        Past Medical History:  Diagnosis Date  . Diabetes mellitus without complication (HCC)    Gestational  . Intermenstrual spotting due to IUD (HCC) 06/28/2014  . IUD (intrauterine device) in place 06/28/2014  . Preterm labor   . Vaginal discharge 06/28/2014    Patient Active Problem List   Diagnosis Date Noted  . Hypertension in pregnancy, preeclampsia, severe, antepartum, third trimester 09/01/2017  . Encounter for IUD removal 05/16/2015  . Intermenstrual spotting due to IUD (HCC) 06/28/2014  . Vaginal discharge 06/28/2014  . BV (bacterial vaginosis) 06/28/2014  . Eczema 07/24/2013    Past Surgical History:  Procedure  Laterality Date  . NO PAST SURGERIES    . TUBAL LIGATION N/A 09/03/2017   Procedure: POST PARTUM TUBAL LIGATION;  Surgeon: Ward, Elenora Fender, MD;  Location: [facility] ORS;  Service: Gynecology;  Laterality: N/A;    Prior to Admission medications   Medication Sig Start Date End Date Taking? Authorizing Provider  ibuprofen (ADVIL,MOTRIN) 600 MG tablet Take 1 tablet (600 mg total) by mouth every 6 (six) hours. 09/04/17  Yes Schermerhorn, Ihor Austin, MD  Multiple Vitamin (MULTIVITAMIN) tablet Take 1 tablet by mouth daily.   Yes [provider]  albuterol (PROVENTIL HFA;VENTOLIN HFA) 108 (90 Base) MCG/ACT inhaler Inhale 2-4 puffs by mouth every 4 hours as needed for wheezing, cough, and/or shortness of breath 02/13/19   Loleta Rose, MD    Allergies Patient has no known allergies.  Family History  Problem Relation Age of Onset  . Cancer Paternal Grandmother        lung  . Ovarian cancer Maternal Aunt     Social History Social History   Tobacco Use  . Smoking status: Current Every Day Smoker    Packs/day: 0.50    Types: Cigarettes  . Smokeless tobacco: Never Used  Substance Use Topics  . Alcohol use: Yes    Comment: occ  . Drug use: No    Review of Systems Constitutional: No fever/chills.  No myalgias. Eyes: No visual changes. ENT: Some nasal congestion and runny nose.  No sore throat. Cardiovascular: Denies chest pain. Respiratory: Cough and shortness of breath, worse  at night, worse with exertion. Gastrointestinal: No abdominal pain.  No nausea, no vomiting.  No diarrhea.  No constipation. Genitourinary: Negative for dysuria. Musculoskeletal: Negative for neck pain.  Negative for back pain. Integumentary: Negative for rash. Neurological: Negative for headaches, focal weakness or numbness.   ____________________________________________   PHYSICAL EXAM:  VITAL SIGNS: ED Triage Vitals  Enc Vitals Group     BP 02/13/19 0443 135/83     Pulse Rate 02/13/19 0443 93      Resp 02/13/19 0443 17     Temp 02/13/19 0443 98.4 F (36.9 C)     Temp Source 02/13/19 0443 Oral     SpO2 02/13/19 0443 100 %     Weight 02/13/19 0445 66.2 kg (146 lb)     Height 02/13/19 0445 1.651 m (5\' 5" )     Head Circumference --      Peak Flow --      Pain Score 02/13/19 0445 0     Pain Loc --      Pain Edu? --      Excl. in GC? --     Constitutional: Alert and oriented. Well appearing and in no acute distress. Eyes: Conjunctivae are normal.  Head: Atraumatic. Nose: Mild congestion/rhinnorhea. Mouth/Throat: Mucous membranes are moist.  Oropharynx non-erythematous. Neck: No stridor.  No meningeal signs.   Cardiovascular: Normal rate, regular rhythm. Good peripheral circulation. Grossly normal heart sounds. Respiratory: Normal respiratory effort.  No retractions. Lungs CTAB. Gastrointestinal: Soft and nontender. No distention.  Musculoskeletal: No lower extremity tenderness nor edema. No gross deformities of extremities. Neurologic:  Normal speech and language. No gross focal neurologic deficits are appreciated.  Skin:  Skin is warm, dry and intact. No rash noted. Psychiatric: Mood and affect are normal. Speech and behavior are normal.  ____________________________________________   LABS (all labs ordered are listed, but only abnormal results are displayed)  Labs Reviewed - No data to display ____________________________________________  EKG  None - EKG not ordered by ED physician ____________________________________________  RADIOLOGY I, Loleta Rose, personally viewed and evaluated these images (plain radiographs) as part of my medical decision making, as well as reviewing the written report by the radiologist.  ED MD interpretation: No evidence of infectious disease on chest x-ray.  Official radiology report(s): Dg Chest 2 View  Result Date: 02/13/2019 CLINICAL DATA:  Shortness of breath EXAM: CHEST - 2 VIEW COMPARISON:  07/22/2016 FINDINGS: Normal heart size  and mediastinal contours. No acute infiltrate or edema. No effusion or pneumothorax. No acute osseous findings. Mild scoliotic curvature. IMPRESSION: No active cardiopulmonary disease. Electronically Signed   By: Marnee Spring M.D.   On: 02/13/2019 05:23    ____________________________________________   PROCEDURES   Procedure(s) performed (including Critical Care):  Procedures   ____________________________________________   INITIAL IMPRESSION / MDM / ASSESSMENT AND PLAN / ED COURSE  As part of my medical decision making, I reviewed the following data within the electronic MEDICAL RECORD NUMBER Nursing notes reviewed and incorporated, Old chart reviewed, Radiograph reviewed  and Notes from prior ED visits         Differential diagnosis includes, but is not limited to, viral URI with cough, pneumonia, influenza, much less likely COVID-19.  I explained to the patient that she does not meet criteria for testing at this point and that she is very low risk; even though she works at [employer], they have not had any confirmed specimens and it is unclear that she even works with the samples.  She has  not been in contact with any people known to have the virus nor has she done any travel that would put her at risk.  She has no fever and a nonspecific viral illness is a very reasonable alternative diagnosis.  No chest x-ray findings.  Normal vital signs.  She wanted to try DuoNeb which did help ease her work of breathing a bit even though her lung sounds were clear initially.  I will not put her on steroids but I am giving her a prescription for an albuterol inhaler and encourage close outpatient follow-up with her primary care doctor.  I gave my usual customary return precautions.     ____________________________________________  FINAL CLINICAL IMPRESSION(S) / ED DIAGNOSES  Final diagnoses:  Viral URI with cough     MEDICATIONS GIVEN DURING THIS VISIT:  Medications  albuterol (PROVENTIL)  (2.5 MG/3ML) 0.083% nebulizer solution 5 mg (has no administration in time range)  ipratropium-albuterol (DUONEB) 0.5-2.5 (3) MG/3ML nebulizer solution 3 mL (3 mLs Nebulization Given 02/13/19 0536)     ED Discharge Orders         Ordered    albuterol (PROVENTIL HFA;VENTOLIN HFA) 108 (90 Base) MCG/ACT inhaler     02/13/19 0531           Note:  This document was prepared using Dragon voice recognition software and may include unintentional dictation errors.   Loleta Rose, MD 02/13/19 [phone]

## 2019-02-13 NOTE — Discharge Instructions (Addendum)

## 2020-10-28 ENCOUNTER — Ambulatory Visit
Admission: EM | Admit: 2020-10-28 | Discharge: 2020-10-28 | Disposition: A | Discharge status: Home / Self Care | Payer: HRSA Program | Attending: Emergency Medicine | Admitting: Emergency Medicine

## 2020-10-28 ENCOUNTER — Other Ambulatory Visit: Payer: Self-pay

## 2020-10-28 ENCOUNTER — Encounter: Payer: Self-pay | Admitting: Emergency Medicine

## 2020-10-28 DIAGNOSIS — Z20822 Contact with and (suspected) exposure to covid-19: Secondary | ICD-10-CM | POA: Diagnosis present

## 2020-10-28 NOTE — ED Triage Notes (Signed)
Patient states she was exposed to COVID at work by her co-worker. Her co-worker tested positive today. Patient denies symptoms.

## 2020-10-29 LAB — SARS CORONAVIRUS 2 (TAT 6-24 HRS): SARS Coronavirus 2: NEGATIVE

## 2021-01-21 ENCOUNTER — Other Ambulatory Visit: Payer: Self-pay

## 2021-01-21 ENCOUNTER — Ambulatory Visit (INDEPENDENT_AMBULATORY_CARE_PROVIDER_SITE_OTHER): Payer: Managed Care, Other (non HMO) | Admitting: Obstetrics

## 2021-01-21 ENCOUNTER — Encounter: Payer: Self-pay | Admitting: Obstetrics

## 2021-01-21 VITALS — BP 126/84 | Ht 65.0 in | Wt 163.0 lb

## 2021-01-21 DIAGNOSIS — Z113 Encounter for screening for infections with a predominantly sexual mode of transmission: Secondary | ICD-10-CM

## 2021-01-21 DIAGNOSIS — Z716 Tobacco abuse counseling: Secondary | ICD-10-CM

## 2021-01-21 DIAGNOSIS — Z124 Encounter for screening for malignant neoplasm of cervix: Secondary | ICD-10-CM

## 2021-01-21 MED ORDER — BUPROPION HCL ER (XL) 150 MG PO TB24: 150.0000 mg | ORAL_TABLET | Freq: Every day | ORAL | 1 refills | Status: DC

## 2021-01-21 NOTE — Progress Notes (Signed)
Gynecology Annual Exam   PCP: [facility]  Chief Complaint:  Chief Complaint  Patient presents with  . Annual Exam    History of Present Illness: Patient is a 40 y.o. [id number] presents for annual exam. The patient has no complaints today. She is expressing an interest in smoking cessation. Has tried nicotine patches unsuccessfully.  LMP: Patient's last menstrual period was 01/14/2021. Average Interval: regular, 28 days Duration of flow: 4 days Heavy Menses: no Clots: no Intermenstrual Bleeding: no Postcoital Bleeding: no Dysmenorrhea: no  The patient is sexually active. She currently uses tubal ligation for contraception. She denies dyspareunia.  The patient does not perform self breast exams.  There is a notable family history of breast or ovarian cancer in her family. Her aunt had ovarian cancer diagnosed at age 73.  The patient wears seatbelts: yes.   The patient has regular exercise: no.    The patient denies current symptoms of depression.    Review of Systems: Review of Systems  Constitutional: Positive for malaise/fatigue.  HENT: Negative.   Eyes: Negative.   Respiratory: Negative.   Cardiovascular: Negative.   Gastrointestinal: Negative.   Musculoskeletal: Negative.   Skin: Negative.   Neurological: Negative.   Endo/Heme/Allergies: Negative.   Psychiatric/Behavioral: Negative.        Denies any formal diagnosiso f depression, but shares some hx of low mood    Past Medical History:  Patient Active Problem List   Diagnosis Date Noted  . Vaginal discharge 06/28/2014  . Eczema 07/24/2013    "all over", affects areola.     Past Surgical History:  Past Surgical History:  Procedure Laterality Date  . NO PAST SURGERIES    . TUBAL LIGATION N/A 09/03/2017   Procedure: POST PARTUM TUBAL LIGATION;  Surgeon: Ward, Elenora Fender, MD;  Location: [facility] ORS;  Service: Gynecology;  Laterality: N/A;    Gynecologic History:  Patient's  last menstrual period was 01/14/2021. Contraception: tubal ligation Last Pap: Results were: no abnormalities   Obstetric History: [id number]  Family History:  Family History  Problem Relation Age of Onset  . Cancer Paternal Grandmother        lung  . Ovarian cancer Maternal Aunt     Social History:  Social History   Socioeconomic History  . Marital status: Single    Spouse name: Not on file  . Number of children: Not on file  . Years of education: Not on file  . Highest education level: Not on file  Occupational History  . Not on file  Tobacco Use  . Smoking status: Current Every Day Smoker    Packs/day: 0.50    Types: Cigarettes  . Smokeless tobacco: Never Used  . Tobacco comment: started on Wellbutrin 150 mg 12/2020  Substance and Sexual Activity  . Alcohol use: Yes    Comment: occ  . Drug use: No  . Sexual activity: Yes    Birth control/protection: Surgical  Other Topics Concern  . Not on file  Social History Narrative  . Not on file   Social Determinants of Health   Financial Resource Strain: Not on file  Food Insecurity: Not on file  Transportation Needs: Not on file  Physical Activity: Not on file  Stress: Not on file  Social Connections: Not on file  Intimate Partner Violence: Not on file    Allergies:  No Known Allergies  Medications: Prior to Admission medications   Medication Sig Start Date End Date Taking?  Authorizing Provider  albuterol (PROVENTIL HFA;VENTOLIN HFA) 108 (90 Base) MCG/ACT inhaler Inhale 2-4 puffs by mouth every 4 hours as needed for wheezing, cough, and/or shortness of breath 02/13/19  Yes Loleta Rose, MD  buPROPion (WELLBUTRIN XL) 150 MG 24 hr tablet Take 1 tablet (150 mg total) by mouth daily. 01/21/21  Yes Mirna Mires, CNM  Multiple Vitamin (MULTIVITAMIN) tablet Take 1 tablet by mouth daily.   Yes [provider]  ibuprofen (ADVIL,MOTRIN) 600 MG tablet Take 1 tablet (600 mg total) by mouth every 6 (six)  hours. Patient not taking: Reported on 01/21/2021 09/04/17   Schermerhorn, Ihor Austin, MD    Physical Exam Vitals: Blood pressure 126/84, height 5\' 5"  (1.651 m), weight 163 lb (73.9 kg), last menstrual period 01/14/2021.  General: NAD HEENT: normocephalic, anicteric Thyroid: no enlargement, no palpable nodules Pulmonary: No increased work of breathing, CTAB Cardiovascular: RRR, distal pulses 2+ Breast: Breast symmetrical, no tenderness, no palpable nodules or masses, no skin or nipple retraction present, no nipple discharge.  No axillary or supraclavicular lymphadenopathy. Abdomen: NABS, soft, non-tender, non-distended.  Umbilicus without lesions.  No hepatomegaly, splenomegaly or masses palpable. No evidence of hernia  Genitourinary:  External: Normal external female genitalia.  Normal urethral meatus, normal Bartholin's and Skene's glands.    Vagina: Normal vaginal mucosa, no evidence of prolapse.    Cervix: Grossly normal in appearance, no bleeding  Uterus: Non-enlarged, mobile, normal contour.  No CMT  Adnexa: ovaries non-enlarged, no adnexal masses  Rectal: deferred  Lymphatic: no evidence of inguinal lymphadenopathy Extremities: no edema, erythema, or tenderness Neurologic: Grossly intact Psychiatric: mood appropriate, affect full  Female chaperone present for pelvic and breast  portions of the physical exam    Assessment: 40 y.o. 24 routine annual exam Interest in smoking cessation  Plan: Problem List Items Addressed This Visit   None   Visit Diagnoses    Encounter for smoking cessation counseling    -  Primary   Relevant Medications   buPROPion (WELLBUTRIN XL) 150 MG 24 hr tablet   Cervical cancer screening       Relevant Orders   IGP,CtNgTv,Apt HPV,rfx16/18,45   Screen for STD (sexually transmitted disease)       Relevant Orders   HEP, RPR, HIV Panel   IGP,CtNgTv,Apt HPV,rfx16/18,45      2) STI screening  was offered and accepted  2)  ASCCP guidelines  and rational discussed.  Patient opts for yearly screening interval  3) Contraception - the patient is currently using  tubal ligation.  She is happy with her current form of contraception and plans to continue  4) Routine healthcare maintenance including cholesterol, diabetes screening discussed managed by PCP  5) Return in about 4 weeks (around 02/18/2021) for medication visit with MMF.  6)  We discussed a trial of Bupropion to assist  with smoking cessation.  She has not had success with the use of patches. A careful discussion regarding how to slowly cut back on her # of daily cigarettes is provided.  She will start with Wellbutrin daily. Side effects, benefits of the medication covered. She will return in  One month for follow up and to check on her cigarette consumption.  02/20/2021, CNM  01/21/2021 12:26 PM    [facility], [facility] 01/21/2021, 12:01 PM

## 2021-01-22 LAB — HEP, RPR, HIV PANEL
HIV Screen 4th Generation wRfx: NONREACTIVE
Hepatitis B Surface Ag: NEGATIVE
RPR Ser Ql: NONREACTIVE

## 2021-01-25 LAB — IGP,CTNGTV,APT HPV,RFX16/18,45
Chlamydia, Nuc. Acid Amp: NEGATIVE
Gonococcus, Nuc. Acid Amp: NEGATIVE
HPV Aptima: NEGATIVE
Trich vag by NAA: NEGATIVE

## 2021-01-29 ENCOUNTER — Encounter: Payer: Self-pay | Admitting: Obstetrics

## 2021-02-17 ENCOUNTER — Ambulatory Visit: Payer: Managed Care, Other (non HMO) | Admitting: Obstetrics

## 2021-07-02 ENCOUNTER — Other Ambulatory Visit: Payer: Self-pay

## 2021-07-02 ENCOUNTER — Encounter: Payer: Self-pay | Admitting: Obstetrics and Gynecology

## 2021-07-02 ENCOUNTER — Ambulatory Visit (INDEPENDENT_AMBULATORY_CARE_PROVIDER_SITE_OTHER): Payer: Managed Care, Other (non HMO) | Admitting: Obstetrics and Gynecology

## 2021-07-02 VITALS — BP 114/70 | Ht 64.0 in | Wt 163.0 lb

## 2021-07-02 DIAGNOSIS — A6004 Herpesviral vulvovaginitis: Secondary | ICD-10-CM

## 2021-07-02 DIAGNOSIS — B373 Candidiasis of vulva and vagina: Secondary | ICD-10-CM | POA: Diagnosis not present

## 2021-07-02 DIAGNOSIS — N898 Other specified noninflammatory disorders of vagina: Secondary | ICD-10-CM

## 2021-07-02 DIAGNOSIS — R102 Pelvic and perineal pain: Secondary | ICD-10-CM

## 2021-07-02 DIAGNOSIS — B3731 Acute candidiasis of vulva and vagina: Secondary | ICD-10-CM

## 2021-07-02 DIAGNOSIS — R875 Abnormal microbiological findings in specimens from female genital organs: Secondary | ICD-10-CM

## 2021-07-02 MED ORDER — VALACYCLOVIR HCL 1 G PO TABS: 1000.0000 mg | ORAL_TABLET | Freq: Two times a day (BID) | ORAL | 0 refills | Status: DC

## 2021-07-02 MED ORDER — FLUCONAZOLE 150 MG PO TABS
150.0000 mg | ORAL_TABLET | ORAL | 0 refills | Status: AC
Start: 2021-07-02 — End: 2021-07-06

## 2021-07-02 NOTE — Progress Notes (Signed)
Patient ID: Maria Reynolds, female   DOB: May 23, 1981, 40 y.o.   MRN: [id number]  Reason for Consult: Gynecologic Exam   Referred by Health, [facility] *  Subjective:     HPI:  Maria Reynolds is a 40 y.o. female she reports that earlier this week she noticed a painful lump on her bottom.  She had also been having vaginal itching and discharge.  She is started an over-the-counter antifungal medicine for yeast infection but has not experienced a resolution in her symptoms.  She also has noted a swollen lymph node in her right groin.  She denies any new sexual partners.  She denies any history of herpes.  Gynecological History  Patient's last menstrual period was 06/20/2021.  Past Medical History:  Diagnosis Date   Gestational diabetes    Intermenstrual spotting due to IUD (HCC) 06/28/2014   IUD (intrauterine device) in place 06/28/2014   Preterm labor    Vaginal discharge 06/28/2014   Family History  Problem Relation Age of Onset   Cancer Paternal Grandmother        lung   Ovarian cancer Maternal Aunt    Past Surgical History:  Procedure Laterality Date   NO PAST SURGERIES     TUBAL LIGATION N/A 09/03/2017   Procedure: POST PARTUM TUBAL LIGATION;  Surgeon: Ward, Elenora Fender, MD;  Location: [facility] ORS;  Service: Gynecology;  Laterality: N/A;    Short Social History:  Social History   Tobacco Use   Smoking status: Every Day    Packs/day: 0.50    Types: Cigarettes   Smokeless tobacco: Never   Tobacco comments:    started on Wellbutrin 150 mg 12/2020  Substance Use Topics   Alcohol use: Yes    Comment: occ    No Known Allergies  Current Outpatient Medications  Medication Sig Dispense Refill   fluconazole (DIFLUCAN) 150 MG tablet Take 1 tablet (150 mg total) by mouth every 3 (three) days for 2 doses. 2 tablet 0   Multiple Vitamin (MULTIVITAMIN) tablet Take 1 tablet by mouth daily.     valACYclovir (VALTREX) 1000 MG tablet Take 1 tablet (1,000 mg total) by mouth 2 (two)  times daily. Take for ten days. 20 tablet 0   albuterol (PROVENTIL HFA;VENTOLIN HFA) 108 (90 Base) MCG/ACT inhaler Inhale 2-4 puffs by mouth every 4 hours as needed for wheezing, cough, and/or shortness of breath (Patient not taking: Reported on 07/02/2021) 1 Inhaler 1   buPROPion (WELLBUTRIN XL) 150 MG 24 hr tablet Take 1 tablet (150 mg total) by mouth daily. (Patient not taking: Reported on 07/02/2021) 30 tablet 1   ibuprofen (ADVIL,MOTRIN) 600 MG tablet Take 1 tablet (600 mg total) by mouth every 6 (six) hours. (Patient not taking: Reported on 01/21/2021) 30 tablet 0   No current facility-administered medications for this visit.    Review of Systems  Constitutional: Negative for chills, fatigue, fever and unexpected weight change.  HENT: Negative for trouble swallowing.  Eyes: Negative for loss of vision.  Respiratory: Negative for cough, shortness of breath and wheezing.  Cardiovascular: Negative for chest pain, leg swelling, palpitations and syncope.  GI: Negative for abdominal pain, blood in stool, diarrhea, nausea and vomiting.  GU: Negative for difficulty urinating, dysuria, frequency and hematuria.  Musculoskeletal: Negative for back pain, leg pain and joint pain.  Skin: Negative for rash.  Neurological: Negative for dizziness, headaches, light-headedness, numbness and seizures.  Psychiatric: Negative for behavioral problem, confusion, depressed mood and sleep disturbance.  Objective:  Objective   Vitals:   07/02/21 1137  BP: 114/70  Weight: 163 lb (73.9 kg)  Height: 5\' 4"  (1.626 m)   Body mass index is 27.98 kg/m.  Physical Exam Vitals and nursing note reviewed. Exam conducted with a chaperone present.  Constitutional:      Appearance: Normal appearance. She is well-developed.  HENT:     Head: Normocephalic and atraumatic.  Eyes:     Extraocular Movements: Extraocular movements intact.     Pupils: Pupils are equal, round, and reactive to light.  Cardiovascular:      Rate and Rhythm: Normal rate and regular rhythm.  Pulmonary:     Effort: Pulmonary effort is normal. No respiratory distress.     Breath sounds: Normal breath sounds.  Abdominal:     General: Abdomen is flat.     Palpations: Abdomen is soft.  Genitourinary:    Labia:        Right: Lesion present.        Comments: External: Normal appearing vulva. Lesion noted on right labia.  Cluster of small vesicles seen. Speculum examination: Normal appearing cervix. No blood in the vaginal vault. Thick white clumpy discharge.   Bimanual examination: Uterus midline, non-tender, normal in size, shape and contour.  No CMT. No adnexal masses. No adnexal tenderness. Pelvis not fixed.  Breast exam: exam not performed Musculoskeletal:        General: No signs of injury.  Lymphadenopathy:     Lower Body: Right inguinal adenopathy present.  Skin:    General: Skin is warm and dry.  Neurological:     Mental Status: She is alert and oriented to person, place, and time.  Psychiatric:        Behavior: Behavior normal.        Thought Content: Thought content normal.        Judgment: Judgment normal.   Wet Prep: Clue Cells: Negative Fungal elements: Negative Trichomonas: Negative   Assessment/Plan:     40 year old with vulvar lesion and abnormal discharge Will treat presumptively for herpes.  Prescription for Valtrex sent.  Herpes serology ordered. Will treat yeast vaginitis with Diflucan new swab ordered for confirmation.  More than 20 minutes were spent face to face with the patient in the room, reviewing the medical record, labs and images, and coordinating care for the patient. The plan of management was discussed in detail and counseling was provided.      41 MD [facility], [facility] 07/02/2021 12:41 PM

## 2021-07-05 LAB — HSV(HERPES SMPLX)ABS-I+II(IGG+IGM)-BLD
HSV 1 Glycoprotein G Ab, IgG: <0.91 index (ref 0.00–0.90)
HSV 2 IgG, Type Spec: 8.3 index — ABNORMAL HIGH (ref 0.00–0.90)
HSVI/II Comb IgM: 1.22 Ratio — ABNORMAL HIGH (ref 0.00–0.90)

## 2021-07-07 LAB — NUSWAB VAGINITIS PLUS (VG+)
Atopobium vaginae: HIGH Score — AB
BVAB 2: HIGH Score — AB
Candida albicans, NAA: NEGATIVE
Candida glabrata, NAA: NEGATIVE
Chlamydia trachomatis, NAA: NEGATIVE
Megasphaera 1: HIGH Score — AB
Neisseria gonorrhoeae, NAA: NEGATIVE
Trich vag by NAA: NEGATIVE

## 2021-07-21 ENCOUNTER — Encounter: Payer: Self-pay | Admitting: Obstetrics and Gynecology

## 2021-07-21 ENCOUNTER — Ambulatory Visit (INDEPENDENT_AMBULATORY_CARE_PROVIDER_SITE_OTHER): Payer: Managed Care, Other (non HMO) | Admitting: Obstetrics and Gynecology

## 2021-07-21 ENCOUNTER — Other Ambulatory Visit: Payer: Self-pay

## 2021-07-21 VITALS — BP 116/70 | Ht 64.0 in | Wt 130.6 lb

## 2021-07-21 DIAGNOSIS — B009 Herpesviral infection, unspecified: Secondary | ICD-10-CM | POA: Diagnosis not present

## 2021-07-21 DIAGNOSIS — N9089 Other specified noninflammatory disorders of vulva and perineum: Secondary | ICD-10-CM | POA: Diagnosis not present

## 2021-07-21 DIAGNOSIS — N760 Acute vaginitis: Secondary | ICD-10-CM

## 2021-07-21 MED ORDER — VALACYCLOVIR HCL 500 MG PO TABS
500.0000 mg | ORAL_TABLET | Freq: Two times a day (BID) | ORAL | 5 refills | Status: AC
Start: 2021-07-21 — End: 2021-07-24

## 2021-07-21 NOTE — Progress Notes (Signed)
Patient ID: Maria Reynolds, female   DOB: [birth year], 40 y.o.   MRN: [id number]  Reason for Consult: No chief complaint on file.   Referred by Health, [facility] *  Subjective:     HPI:  Maria Reynolds is a 40 y.o. female she is following up for prior vulvar lesion.  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  She reports that she did not take Valtrex and that hand lesion eventually resolved on its own.  She self treated her bacterial vaginosis with boric acid suppositories.  Gynecological History  Patient's last menstrual period was 07/14/2021.  Past Medical History:  Diagnosis Date   Gestational diabetes    Intermenstrual spotting due to IUD (HCC) 06/28/2014   IUD (intrauterine device) in place 06/28/2014   Preterm labor    Vaginal discharge 06/28/2014   Family History  Problem Relation Age of Onset   Cancer Paternal Grandmother        lung   Ovarian cancer Maternal Aunt    Past Surgical History:  Procedure Laterality Date   NO PAST SURGERIES     TUBAL LIGATION N/A 09/03/2017   Procedure: POST PARTUM TUBAL LIGATION;  Surgeon: Ward, Elenora Fender, MD;  Location: [facility] ORS;  Service: Gynecology;  Laterality: N/A;    Short Social History:  Social History   Tobacco Use   Smoking status: Every Day    Packs/day: 0.50    Types: Cigarettes   Smokeless tobacco: Never   Tobacco comments:    started on Wellbutrin 150 mg 12/2020  Substance Use Topics   Alcohol use: Yes    Comment: occ    No Known Allergies  Current Outpatient Medications  Medication Sig Dispense Refill   valACYclovir (VALTREX) 500 MG tablet Take 1 tablet (500 mg total) by mouth 2 (two) times daily for 3 days. Can increase to twice a day for 5 days in the event of a recurrence 6 tablet 5   albuterol (PROVENTIL HFA;VENTOLIN HFA) 108 (90 Base) MCG/ACT inhaler Inhale 2-4 puffs by mouth every 4 hours as needed for wheezing, cough, and/or shortness of breath (Patient not taking: Reported on 07/02/2021) 1 Inhaler 1    buPROPion (WELLBUTRIN XL) 150 MG 24 hr tablet Take 1 tablet (150 mg total) by mouth daily. (Patient not taking: Reported on 07/02/2021) 30 tablet 1   ibuprofen (ADVIL,MOTRIN) 600 MG tablet Take 1 tablet (600 mg total) by mouth every 6 (six) hours. (Patient not taking: Reported on 01/21/2021) 30 tablet 0   Multiple Vitamin (MULTIVITAMIN) tablet Take 1 tablet by mouth daily. (Patient not taking: Reported on 07/21/2021)     No current facility-administered medications for this visit.    Review of Systems  Constitutional: Negative for chills, fatigue, fever and unexpected weight change.  HENT: Negative for trouble swallowing.  Eyes: Negative for loss of vision.  Respiratory: Negative for cough, shortness of breath and wheezing.  Cardiovascular: Negative for chest pain, leg swelling, palpitations and syncope.  GI: Negative for abdominal pain, blood in stool, diarrhea, nausea and vomiting.  GU: Negative for difficulty urinating, dysuria, frequency and hematuria.  Musculoskeletal: Negative for back pain, leg pain and joint pain.  Skin: Negative for rash.  Neurological: Negative for dizziness, headaches, light-headedness, numbness and seizures.  Psychiatric: Negative for behavioral problem, confusion, depressed mood and sleep disturbance.       Objective:  Objective   Vitals:   07/21/21 1136  BP: 116/70  Weight: 130 lb 9.6 oz (59.2 kg)  Height: 5\' 4"  (  1.626 m)   Body mass index is 22.42 kg/m.  Physical Exam Vitals and nursing note reviewed. Exam conducted with a chaperone present.  Constitutional:      Appearance: Normal appearance.  HENT:     Head: Normocephalic and atraumatic.  Eyes:     Extraocular Movements: Extraocular movements intact.     Pupils: Pupils are equal, round, and reactive to light.  Cardiovascular:     Rate and Rhythm: Normal rate and regular rhythm.  Pulmonary:     Effort: Pulmonary effort is normal.     Breath sounds: Normal breath sounds.  Abdominal:      General: Abdomen is flat.     Palpations: Abdomen is soft.  Musculoskeletal:     Cervical back: Normal range of motion.  Skin:    General: Skin is warm and dry.  Neurological:     General: No focal deficit present.     Mental Status: She is alert and oriented to person, place, and time.  Psychiatric:        Behavior: Behavior normal.        Thought Content: Thought content normal.        Judgment: Judgment normal.    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 Discussed episodic versus suppressive therapy for HSV.  Patient desires episodic therapy at this time.  Prescription sent. Patient reports resolution of bacterial vaginosis symptoms.  More than 10 minutes were spent face to face with the patient in the room, reviewing the medical record, labs and images, and coordinating care for the patient. The plan of management was discussed in detail and counseling was provided.     Adelene Idler MD [facility], [facility] 07/21/2021 12:09 PM

## 2021-07-21 NOTE — Patient Instructions (Signed)
[email or web address]">  Genital Herpes Genital herpes is a common sexually transmitted infection (STI) that is caused by a virus. The virus spreads from person to person through sexual contact. The infection can cause itching, blisters, and sores around the genitals or rectum. Symptoms may last for several days and then go away. This is called an outbreak. The virus remains in the body, however, so more outbreaks may happen in the future. The time between outbreaks varies and can be from months toyears. Genital herpes can affect anyone. It is particularly concerning for pregnant women because the virus can be passed to the baby during delivery and can cause serious problems. Genital herpes is also a concern for people who have a weak disease-fighting system (immune system). What are the causes? This condition is caused by the human herpesvirus, also called herpes simplex virus, type 1 or type 2 (HSV-1 or HSV-2). The virus may spread through: Sexual contact with an infected person, including vaginal, anal, and oral sex. Contact with fluid from a herpes sore. The skin. This means that you can get herpes from an infected partner even if there are no blisters or sores present. Your partner may not know that he or she is infected. What increases the risk? You are more likely to develop this condition if: You have sex with many partners. You do not use latex condoms during sex. What are the signs or symptoms? Most people do not have symptoms (are asymptomatic), or they have mild symptoms that may be mistaken for other skin problems. Symptoms may include: Small, red bumps near the genitals, rectum, or mouth. These bumps turn into blisters and then sores. Flu-like symptoms, including: Fever. Body aches. Swollen lymph nodes. Headache. Painful urination. Pain and itching in the genital area or rectal area. Vaginal discharge. Tingling  or shooting pain in the legs and buttocks. Generally, symptoms are more severe and last longer during the first (primary) outbreak. Flu-like symptoms are also more common during the primary outbreak. How is this diagnosed? This condition may be diagnosed based on: A physical exam. Your medical history. Blood tests. A test of a fluid sample (culture) from an open sore. How is this treated? There is no cure for this condition, but treatment with antiviral medicines that are taken by mouth (orally) can do the following: Speed up healing and relieve symptoms. Help to reduce the spread of the virus to sexual partners. Limit the chance of future outbreaks, or make future outbreaks shorter. Lessen symptoms of future outbreaks. Your health care provider may also recommend pain relief medicines, such asaspirin or ibuprofen. Follow these instructions at home: If you have an outbreak: Keep the affected areas dry and clean. Avoid rubbing or touching blisters and sores. If you do touch blisters or sores: Wash your hands thoroughly with soap and water. Do not touch your eyes afterward. To help relieve pain or itching, you may take the following actions as told by your health care provider: Apply a cold, wet cloth (cold compress) to affected areas 4-6 times a day. Apply a substance that protects your skin and reduces bleeding (astringent). Apply a gel that helps relieve pain around sores (lidocaine gel). Take a warm, shallow bath that cleans the genital area (sitz bath). Sexual activity Do not have sexual contact during active outbreaks. Practice safe sex. Herpes can spread even if your partner does not have blisters or sores. Latex condoms and female condoms may help prevent the spread of the herpes virus. General instructions Take over-the-counter and   prescription medicines only as told by your health care provider. Keep all follow-up visits as told by your health care provider. This is  important. How is this prevented? Use condoms. Although you can get genital herpes during sexual contact even with the use of a condom, a condom can provide some protection. Avoid having multiple sexual partners. Talk with your sexual partner about any symptoms either of you may have. Also, talk with your partner about any history of STIs. Get tested for STIs before you have sex. Ask your partner to do the same. Do not have sexual contact if you have active symptoms of genital herpes. Contact a health care provider if: Your symptoms are not improving with medicine. Your symptoms return, or you have new symptoms. You have a fever. You have abdominal pain. You have redness, swelling, or pain in your eye. You notice new sores on other parts of your body. You are a woman and you experience bleeding between menstrual periods. You have had herpes and you become pregnant or plan to become pregnant. Summary Genital herpes is a common sexually transmitted infection (STI) that is caused by the herpes simplex virus, type 1 or type 2 (HSV-1 or HSV-2). These viruses are most often spread through sexual contact with an infected person. You are more likely to develop this condition if you have sex with many partners or you do not use condoms during sex. Most people do not have symptoms (are asymptomatic) or have mild symptoms that may be mistaken for other skin problems. Symptoms occur as outbreaks that may happen months or years apart. There is no cure for this condition, but treatment with oral antiviral medicines can reduce symptoms, reduce the chance of spreading the virus to a partner, prevent future outbreaks, or shorten future outbreaks. This information is not intended to replace advice given to you by your health care provider. Make sure you discuss any questions you have with your healthcare provider. Document Revised: 07/27/2019 Document Reviewed: 07/27/2019 Elsevier Patient Education  2022  Elsevier Inc.  

## 2021-10-14 ENCOUNTER — Other Ambulatory Visit: Payer: Self-pay

## 2021-10-14 ENCOUNTER — Encounter: Payer: Self-pay | Admitting: Obstetrics

## 2021-10-14 ENCOUNTER — Other Ambulatory Visit (HOSPITAL_COMMUNITY)
Admission: RE | Admit: 2021-10-14 | Discharge: 2021-10-14 | Disposition: A | Discharge status: Home / Self Care | Payer: Managed Care, Other (non HMO) | Source: Ambulatory Visit | Attending: Obstetrics | Admitting: Obstetrics

## 2021-10-14 ENCOUNTER — Ambulatory Visit (INDEPENDENT_AMBULATORY_CARE_PROVIDER_SITE_OTHER): Payer: Managed Care, Other (non HMO) | Admitting: Obstetrics

## 2021-10-14 VITALS — BP 118/68 | Ht 64.0 in | Wt 157.0 lb

## 2021-10-14 DIAGNOSIS — N760 Acute vaginitis: Secondary | ICD-10-CM | POA: Diagnosis not present

## 2021-10-14 DIAGNOSIS — B009 Herpesviral infection, unspecified: Secondary | ICD-10-CM | POA: Diagnosis not present

## 2021-10-14 DIAGNOSIS — N898 Other specified noninflammatory disorders of vagina: Secondary | ICD-10-CM | POA: Insufficient documentation

## 2021-10-14 DIAGNOSIS — B9689 Other specified bacterial agents as the cause of diseases classified elsewhere: Secondary | ICD-10-CM | POA: Diagnosis not present

## 2021-10-14 MED ORDER — METRONIDAZOLE 500 MG PO TABS: 500.0000 mg | ORAL_TABLET | Freq: Two times a day (BID) | ORAL | 0 refills | Status: DC

## 2021-10-14 NOTE — Progress Notes (Signed)
Obstetrics & Gynecology Office Visit   Chief Complaint:  Chief Complaint  Patient presents with   Vaginal Discharge    Discharge and odor x1 wk. RM 5    History of Present Illness: patient c/o a vaginal discharge   Review of Systems:  Review of Systems  Constitutional: Negative.   HENT: Negative.    Eyes: Negative.   Respiratory: Negative.    Cardiovascular: Negative.   Gastrointestinal: Negative.   Genitourinary: Negative.   Musculoskeletal: Negative.   Skin: Negative.        Labia and vaginal irritation  Neurological: Negative.   Endo/Heme/Allergies: Negative.   Psychiatric/Behavioral: Negative.      Past Medical History:  Past Medical History:  Diagnosis Date   Gestational diabetes    Intermenstrual spotting due to IUD 06/28/2014   IUD (intrauterine device) in place 06/28/2014   Preterm labor    Vaginal discharge 06/28/2014    Past Surgical History:  Past Surgical History:  Procedure Laterality Date   NO PAST SURGERIES     TUBAL LIGATION N/A 09/03/2017   Procedure: POST PARTUM TUBAL LIGATION;  Surgeon: Ward, Elenora Fender, MD;  Location: [facility] ORS;  Service: Gynecology;  Laterality: N/A;    Gynecologic History: Patient's last menstrual period was 10/02/2021.  Obstetric History: [id number]  Family History:  Family History  Problem Relation Age of Onset   Cancer Paternal Grandmother        lung   Ovarian cancer Maternal Aunt     Social History:  Social History   Socioeconomic History   Marital status: Single    Spouse name: Not on file   Number of children: Not on file   Years of education: Not on file   Highest education level: Not on file  Occupational History   Not on file  Tobacco Use   Smoking status: Every Day    Packs/day: 0.50    Types: Cigarettes   Smokeless tobacco: Never   Tobacco comments:    started on Wellbutrin 150 mg 12/2020  Vaping Use   Vaping Use: Never used  Substance and Sexual Activity   Alcohol use: Yes    Comment: occ    Drug use: No   Sexual activity: Yes    Birth control/protection: Surgical  Other Topics Concern   Not on file  Social History Narrative   Not on file   Social Determinants of Health   Financial Resource Strain: Not on file  Food Insecurity: Not on file  Transportation Needs: Not on file  Physical Activity: Not on file  Stress: Not on file  Social Connections: Not on file  Intimate Partner Violence: Not on file    Allergies:  No Known Allergies  Medications: Prior to Admission medications   Medication Sig Start Date End Date Taking? Authorizing Provider  albuterol (PROVENTIL HFA;VENTOLIN HFA) 108 (90 Base) MCG/ACT inhaler Inhale 2-4 puffs by mouth every 4 hours as needed for wheezing, cough, and/or shortness of breath Patient not taking: Reported on 07/02/2021 02/13/19   Loleta Rose, MD  buPROPion (WELLBUTRIN XL) 150 MG 24 hr tablet Take 1 tablet (150 mg total) by mouth daily. Patient not taking: Reported on 07/02/2021 01/21/21   Mirna Mires, CNM  ibuprofen (ADVIL,MOTRIN) 600 MG tablet Take 1 tablet (600 mg total) by mouth every 6 (six) hours. Patient not taking: Reported on 01/21/2021 09/04/17   Schermerhorn, Ihor Austin, MD  Multiple Vitamin (MULTIVITAMIN) tablet Take 1 tablet by mouth daily. Patient not taking: Reported on  07/21/2021    [provider]    Physical Exam Vitals:  Vitals:   10/14/21 1350  BP: 118/68   Patient's last menstrual period was 10/02/2021.  Physical Exam Constitutional:      Appearance: Normal appearance.  HENT:     Head: Normocephalic and atraumatic.  Cardiovascular:     Rate and Rhythm: Normal rate and regular rhythm.  Pulmonary:     Effort: Pulmonary effort is normal.     Breath sounds: Normal breath sounds.  Abdominal:     General: Abdomen is flat.     Palpations: Abdomen is soft.  Genitourinary:    General: Normal vulva.     Vagina: Vaginal discharge present.     Comments: Whit creamy discharge noted at vaginal vault. No  external lesions or areas of irritation noted. Bimanual shows anteverted, non elnalrged uterus- No CMT Musculoskeletal:        General: Normal range of motion.     Cervical back: Normal range of motion and neck supple.  Skin:    General: Skin is warm and dry.  Neurological:     General: No focal deficit present.     Mental Status: She is alert and oriented to person, place, and time.  Psychiatric:        Mood and Affect: Mood normal.        Behavior: Behavior normal.     Assessment: 40 y.o. [id number] No problem-specific Assessment & Plan notes found for this encounter.   Plan: Problem List Items Addressed This Visit       Other   Vaginal discharge - Primary   Relevant Orders   Cervicovaginal ancillary only   HSV infection   Will start her on Flagyl and await the lab results as she has self treated without success and wants assistance now. Mirna Mires, CNM  10/14/2021 2:31 PM

## 2021-10-16 LAB — CERVICOVAGINAL ANCILLARY ONLY
Bacterial Vaginitis (gardnerella): POSITIVE — AB
Candida Glabrata: NEGATIVE
Candida Vaginitis: NEGATIVE
Chlamydia: NEGATIVE
Comment: NEGATIVE
Comment: NEGATIVE
Comment: NEGATIVE
Comment: NEGATIVE
Comment: NEGATIVE
Comment: NORMAL
Neisseria Gonorrhea: NEGATIVE
Trichomonas: POSITIVE — AB

## 2021-10-20 ENCOUNTER — Encounter: Payer: Self-pay | Admitting: Obstetrics

## 2021-10-20 DIAGNOSIS — B9689 Other specified bacterial agents as the cause of diseases classified elsewhere: Secondary | ICD-10-CM

## 2021-10-20 DIAGNOSIS — N898 Other specified noninflammatory disorders of vagina: Secondary | ICD-10-CM

## 2021-10-20 DIAGNOSIS — N760 Acute vaginitis: Secondary | ICD-10-CM

## 2021-10-20 MED ORDER — METRONIDAZOLE 500 MG PO TABS: 500.0000 mg | ORAL_TABLET | Freq: Two times a day (BID) | ORAL | 0 refills | Status: AC

## 2021-10-30 ENCOUNTER — Telehealth: Payer: Self-pay

## 2021-10-30 NOTE — Telephone Encounter (Signed)
Patient requesting return call about a rx she received from Paula Compton, CNM. [phone]

## 2021-10-30 NOTE — Telephone Encounter (Signed)
Spoke w/patient. She states she vomited with at least 2-3 days of taking the metronidazole. She started to have the same discharge as before, no burning or itching, and does not have the smell yet. Inquiring if needs more meds.

## 2021-11-07 NOTE — Telephone Encounter (Signed)
She did complete the course, but she vomited shortly after taking it several days and had diarrhea very bad one day. I have spoken to the patient again to verify her current symptoms. She still has discharge with a mild fishy odor (not as bad as it was). The discharge is thicker than usual. No burning, itching or irritation. Patient inquiring about vaginal cream/gel treatment instead of oral if meds are sent in.

## 2021-11-10 NOTE — Telephone Encounter (Signed)
Patient called and we discussed her missing several doses of metronidazole. She is being treated for both BV and Trich. WE have mutually decided to have her RTC for a TOC in about 3 weeks. She agreed to call the office and make an appointment for a TOC. Mirna Mires, CNM  11/10/2021 4:59 PM

## 2022-01-02 ENCOUNTER — Ambulatory Visit: Payer: Managed Care, Other (non HMO) | Admitting: Obstetrics

## 2022-01-15 ENCOUNTER — Other Ambulatory Visit (HOSPITAL_COMMUNITY)
Admission: RE | Admit: 2022-01-15 | Discharge: 2022-01-15 | Disposition: A | Discharge status: Home / Self Care | Payer: Medicaid Other | Source: Ambulatory Visit | Attending: Obstetrics | Admitting: Obstetrics

## 2022-01-15 ENCOUNTER — Other Ambulatory Visit: Payer: Self-pay

## 2022-01-15 ENCOUNTER — Ambulatory Visit (INDEPENDENT_AMBULATORY_CARE_PROVIDER_SITE_OTHER): Payer: Managed Care, Other (non HMO) | Admitting: Obstetrics

## 2022-01-15 ENCOUNTER — Encounter: Payer: Self-pay | Admitting: Obstetrics

## 2022-01-15 VITALS — BP 128/60 | Ht 64.0 in | Wt 158.0 lb

## 2022-01-15 DIAGNOSIS — N898 Other specified noninflammatory disorders of vagina: Secondary | ICD-10-CM

## 2022-01-15 DIAGNOSIS — A599 Trichomoniasis, unspecified: Secondary | ICD-10-CM | POA: Diagnosis present

## 2022-01-15 MED ORDER — METRONIDAZOLE 0.75 % VA GEL: 1.0000 | Freq: Every day | VAGINAL | 1 refills | Status: AC

## 2022-01-15 NOTE — Progress Notes (Signed)
Maria Reynolds is a 41 y.o. [phone] who LMP was Patient's last menstrual period was 12/31/2021 (exact date)., presents today for a problem visit.   Patient complains of an abnormal vaginal discharge which was treated several months ago and is recurring. Discharge described as: copious, white. Vaginal symptoms include odor.   Menstrual pattern: She had been bleeding regularly. Contraception: tubal ligation.  She reports recent antibiotic exposure (metronidazole), denies changes in soaps, detergents coinciding with the onset of her symptoms.  She has previously self treated or been under treatment by another provider for these symptoms.  BP 128/60    Ht 5\' 4"  (1.626 m)    Wt 158 lb (71.7 kg)    LMP 12/31/2021 (Exact Date)    BMI 27.12 kg/m  \Review of Systems  Constitutional: Negative.   HENT: Negative.    Eyes: Negative.   Respiratory: Negative.    Cardiovascular: Negative.   Gastrointestinal: Negative.   Genitourinary: Negative.   Musculoskeletal: Negative.   Skin: Negative.   Neurological: Negative.   Endo/Heme/Allergies: Negative.   Psychiatric/Behavioral: Negative.     Physical Exam Constitutional:      General: She is not in acute distress.    Appearance: Normal appearance.  HENT:     Head: Normocephalic and atraumatic.  Pulmonary:     Effort: Pulmonary effort is normal.  Genitourinary:    General: Normal vulva.     Comments: White discharge visualized on external exam. Speculum inserted without complaint to reveal copious white, thin discharge at cervical os as well as in vaginal canal. Aptima swab obtained.  Musculoskeletal:        General: Normal range of motion.  Skin:    General: Skin is warm and dry.  Neurological:     General: No focal deficit present.     Mental Status: She is alert and oriented to person, place, and time.  Psychiatric:        Mood and Affect: Mood normal.        Behavior: Behavior normal.        Thought Content: Thought content normal.         Judgment: Judgment normal.    1.) Risk factors for bacterial vaginosis and candida infections discussed.  We discussed normal vaginal flora/microbiome.  Any factors that may alter the microbiome increase the risk of these opportunistic infections.  These include changes in pH, antibiotic exposures, diabetes, wet bathing suits etc.  We discussed that treatment is aimed at eradicating abnormal bacterial overgrowth and or yeast.  There may be some role for vaginal probiotics in restoring normal vaginal flora.    2.) Results of vaginal swab will be communicated via the patient portal.  3.) Prescription for metrogel sent for treatment of vaginal discharge, and difficulty tolerating oral metronidazole.   02/28/2022, CNM  01/15/2022 10:08 AM

## 2022-01-16 ENCOUNTER — Encounter: Payer: Self-pay | Admitting: Obstetrics

## 2022-01-16 LAB — CERVICOVAGINAL ANCILLARY ONLY
Bacterial Vaginitis (gardnerella): POSITIVE — AB
Chlamydia: NEGATIVE
Comment: NEGATIVE
Comment: NEGATIVE
Comment: NEGATIVE
Comment: NORMAL
Neisseria Gonorrhea: NEGATIVE
Trichomonas: NEGATIVE

## 2022-01-19 ENCOUNTER — Other Ambulatory Visit: Payer: Self-pay

## 2022-01-19 ENCOUNTER — Encounter: Payer: Self-pay | Admitting: Nurse Practitioner

## 2022-01-19 ENCOUNTER — Ambulatory Visit (INDEPENDENT_AMBULATORY_CARE_PROVIDER_SITE_OTHER): Payer: Managed Care, Other (non HMO) | Admitting: Nurse Practitioner

## 2022-01-19 VITALS — BP 122/70 | HR 80 | Temp 99.0°F | Ht 63.0 in | Wt 157.6 lb

## 2022-01-19 DIAGNOSIS — O24419 Gestational diabetes mellitus in pregnancy, unspecified control: Secondary | ICD-10-CM | POA: Diagnosis not present

## 2022-01-19 DIAGNOSIS — Z7689 Persons encountering health services in other specified circumstances: Secondary | ICD-10-CM

## 2022-01-19 DIAGNOSIS — R519 Headache, unspecified: Secondary | ICD-10-CM

## 2022-01-19 NOTE — Assessment & Plan Note (Signed)
Patient had a history of gestational diabetes. Feels like when she eats sugar she feels light headed.  Discussed gestational diabetes and increased likelihood of Type 2 Diabetes.  Discussed low carb diet. A1c ordered at visit today. Will make recommendations based on lab results. Follow up in 3 months.  Call sooner if concerns arise.

## 2022-01-19 NOTE — Progress Notes (Signed)
BP 122/70    Pulse 80    Temp 99 F (37.2 C) (Oral)    Ht _0  (1.6 m)    Wt 157 lb 9.6 oz (71.5 kg)    LMP 01/01/2022 (Exact Date)    SpO2 96%    BMI 27.92 kg/m    Subjective:    Patient ID: Maria Reynolds, female    DOB: 08/01/1981, 41 y.o.   MRN: [id number]  HPI: Maria Reynolds is a 41 y.o. female  Chief Complaint  Patient presents with   New Patient (Initial Visit)    Pt states she has been getting a lot of headaches lately, states she thinks it may be because of her BP   Patient presents to clinic to establish care with new PCP.  Introduced to Designer, jewellery role and practice setting.  All questions answered.  Discussed provider/patient relationship and expectations.  Patient reports a history of Elevated blood pressure during pregnancy, gestational diabetes, anxiety. Has history of tubal ligation.  She is a current everyday smoker and smokes about 3-4 cigarettes per day. She is currently using the flagyl get 5 nights a week to help with BV.   Patient denies a history of: Hypertension, Elevated Cholesterol, Thyroid problems, Depression, Neurological problems, and, Asthma, COPD, Abdominal problems.   SMOKING CESSATION Smoking Status: current everyday smoker Smoking Amount: 3-4 cigarettes per day Type of tobacco use: cigarettes Children in the house: yes   Patient states she gets headaches whenever she eats pork.  If she doesn't eat pork she doesn't get headaches.  She feels lightheaded whenever she eats anything with sugar.  Does not drink a lot of sugary drinks such as soda.    Active Ambulatory Problems    Diagnosis Date Noted   Eczema 07/24/2013   Vaginal discharge 06/28/2014   HSV infection 10/14/2021   Former smoker 07/19/2017   History of severe pre-eclampsia 02/12/2017   Iron deficiency anemia 07/19/2017   Trichimoniasis 01/15/2022   Gestational diabetes mellitus (GDM), antepartum 01/19/2022   Frequent headaches 01/19/2022   Resolved Ambulatory Problems     Diagnosis Date Noted   IUD (intrauterine device) in place 06/28/2014   Intermenstrual spotting due to IUD ([place]) 06/28/2014   BV (bacterial vaginosis) 06/28/2014   Encounter for IUD removal 05/16/2015   Braxton Hick's contraction 09/01/2017   Hypertension in pregnancy, preeclampsia, severe, antepartum, third trimester 09/01/2017   Past Medical History:  Diagnosis Date   Gestational diabetes    Herpes simplex    Preterm labor    Past Surgical History:  Procedure Laterality Date   NO PAST SURGERIES     TUBAL LIGATION N/A 09/03/2017   Procedure: POST PARTUM TUBAL LIGATION;  Surgeon: Ward, Honor Loh, MD;  Location: [facility] ORS;  Service: Gynecology;  Laterality: N/A;   Family History  Problem Relation Age of Onset   COPD Mother    Emphysema Mother    Hypertension Father    Ovarian cancer Maternal Aunt    Throat cancer Maternal Grandmother    Cancer Paternal Grandmother        lung   Dementia Paternal Grandmother     Relevant past medical, surgical, family and social history reviewed and updated as indicated. Interim medical history since our last visit reviewed. Allergies and medications reviewed and updated.  Review of Systems  Neurological:  Positive for light-headedness and headaches.   Per HPI unless specifically indicated above     Objective:    BP 122/70  Pulse 80    Temp 99 F (37.2 C) (Oral)    Ht _0  (1.6 m)    Wt 157 lb 9.6 oz (71.5 kg)    LMP 01/01/2022 (Exact Date)    SpO2 96%    BMI 27.92 kg/m   Wt Readings from Last 3 Encounters:  01/19/22 157 lb 9.6 oz (71.5 kg)  01/15/22 158 lb (71.7 kg)  10/14/21 157 lb (71.2 kg)    Physical Exam Vitals and nursing note reviewed.  Constitutional:      General: She is not in acute distress.    Appearance: Normal appearance. She is normal weight. She is not ill-appearing, toxic-appearing or diaphoretic.  HENT:     Head: Normocephalic.     Right Ear: External ear normal.     Left Ear: External ear normal.     Nose:  Nose normal.     Mouth/Throat:     Mouth: Mucous membranes are moist.     Pharynx: Oropharynx is clear.  Eyes:     General:        Right eye: No discharge.        Left eye: No discharge.     Extraocular Movements: Extraocular movements intact.     Conjunctiva/sclera: Conjunctivae normal.     Pupils: Pupils are equal, round, and reactive to light.  Cardiovascular:     Rate and Rhythm: Normal rate and regular rhythm.     Heart sounds: No murmur heard. Pulmonary:     Effort: Pulmonary effort is normal. No respiratory distress.     Breath sounds: Normal breath sounds. No wheezing or rales.  Musculoskeletal:     Cervical back: Normal range of motion and neck supple.  Skin:    General: Skin is warm and dry.     Capillary Refill: Capillary refill takes less than 2 seconds.  Neurological:     General: No focal deficit present.     Mental Status: She is alert and oriented to person, place, and time. Mental status is at baseline.  Psychiatric:        Mood and Affect: Mood normal.        Behavior: Behavior normal.        Thought Content: Thought content normal.        Judgment: Judgment normal.    Results for orders placed or performed in visit on 01/15/22  Cervicovaginal ancillary only  Result Value Ref Range   Bacterial Vaginitis (gardnerella) Positive (A)    Trichomonas Negative    Chlamydia Negative    Neisseria Gonorrhea Negative    Comment      Normal Reference Range Bacterial Vaginosis - Negative   Comment Normal Reference Range Trichomonas - Negative    Comment      Normal Reference Range Neisseria Gonorrhea - Negative   Comment Normal Reference [place] Chlamydia - Negative       Assessment & Plan:   Problem List Items Addressed This Visit       Endocrine   Gestational diabetes mellitus (GDM), antepartum - Primary    Patient had a history of gestational diabetes. Feels like when she eats sugar she feels light headed.  Discussed gestational diabetes and increased  likelihood of Type 2 Diabetes.  Discussed low carb diet. A1c ordered at visit today. Will make recommendations based on lab results. Follow up in 3 months.  Call sooner if concerns arise.       Relevant Orders   HgB A1c  Other   Frequent headaches    Chronic.  Patient has noticed that headaches occur when she eats pork.  Discussed cutting back and eliminating from diet.  Discussed that when she does eat pork to increase water intake to see if that improves symptoms.  Reviewed with patient that pork has increased sodium.  Follow up in 3 months for reevaluation.  Call sooner if concerns arise.       Relevant Orders   Comp Met (CMET)   Other Visit Diagnoses     Encounter to establish care            Follow up plan: Return in about 3 months (around 04/18/2022) for Physical and Fasting labs.

## 2022-01-19 NOTE — Assessment & Plan Note (Signed)
Chronic.  Patient has noticed that headaches occur when she eats pork.  Discussed cutting back and eliminating from diet.  Discussed that when she does eat pork to increase water intake to see if that improves symptoms.  Reviewed with patient that pork has increased sodium.  Follow up in 3 months for reevaluation.  Call sooner if concerns arise.

## 2022-01-20 LAB — COMPREHENSIVE METABOLIC PANEL
ALT: 18 IU/L (ref 0–32)
AST: 17 IU/L (ref 0–40)
Albumin/Globulin Ratio: 1.5 (ref 1.2–2.2)
Albumin: 4.1 g/dL (ref 3.8–4.8)
Alkaline Phosphatase: 73 IU/L (ref 44–121)
BUN/Creatinine Ratio: 24 — ABNORMAL HIGH (ref 9–23)
BUN: 19 mg/dL (ref 6–24)
Bilirubin Total: <0.2 mg/dL (ref 0.0–1.2)
CO2: 20 mmol/L (ref 20–29)
Calcium: 8.9 mg/dL (ref 8.7–10.2)
Chloride: 109 mmol/L — ABNORMAL HIGH (ref 96–106)
Creatinine, Ser: 0.8 mg/dL (ref 0.57–1.00)
Globulin, Total: 2.8 g/dL (ref 1.5–4.5)
Glucose: 86 mg/dL (ref 70–99)
Potassium: 4.8 mmol/L (ref 3.5–5.2)
Sodium: 139 mmol/L (ref 134–144)
Total Protein: 6.9 g/dL (ref 6.0–8.5)
eGFR: 95 mL/min/{1.73_m2} (ref >59)

## 2022-01-20 LAB — HEMOGLOBIN A1C
Est. average glucose Bld gHb Est-mCnc: 114 mg/dL
Hgb A1c MFr Bld: 5.6 % (ref 4.8–5.6)

## 2022-01-20 NOTE — Progress Notes (Signed)
Hi Annessa. Your lab work looks great.  No concerns at this time.  No evidence of diabetes.  We will continue to monitor this at future visits.

## 2022-02-11 ENCOUNTER — Ambulatory Visit (INDEPENDENT_AMBULATORY_CARE_PROVIDER_SITE_OTHER): Payer: Managed Care, Other (non HMO) | Admitting: Internal Medicine

## 2022-02-11 ENCOUNTER — Encounter: Payer: Self-pay | Admitting: Internal Medicine

## 2022-02-11 ENCOUNTER — Other Ambulatory Visit: Payer: Self-pay

## 2022-02-11 VITALS — BP 132/77 | HR 92 | Temp 98.3°F | Ht 62.99 in | Wt 161.4 lb

## 2022-02-11 DIAGNOSIS — N898 Other specified noninflammatory disorders of vagina: Secondary | ICD-10-CM | POA: Diagnosis not present

## 2022-02-11 LAB — URINALYSIS, ROUTINE W REFLEX MICROSCOPIC
Bilirubin, UA: NEGATIVE
Glucose, UA: NEGATIVE
Nitrite, UA: NEGATIVE
RBC, UA: NEGATIVE
Specific Gravity, UA: >1.03 — ABNORMAL HIGH (ref 1.005–1.030)
Urobilinogen, Ur: 1 mg/dL (ref 0.2–1.0)
pH, UA: 6 (ref 5.0–7.5)

## 2022-02-11 LAB — MICROSCOPIC EXAMINATION: RBC, Urine: NONE SEEN /hpf (ref 0–2)

## 2022-02-11 LAB — WET PREP FOR TRICH, YEAST, CLUE
Clue Cell Exam: POSITIVE — AB
Trichomonas Exam: NEGATIVE
Yeast Exam: NEGATIVE

## 2022-02-11 MED ORDER — METRONIDAZOLE 500 MG PO TABS: 500.0000 mg | ORAL_TABLET | Freq: Two times a day (BID) | ORAL | 0 refills | Status: AC

## 2022-02-11 NOTE — Progress Notes (Signed)
? ?BP 132/77   Pulse 92   Temp 98.3 ?F (36.8 ?C) (Oral)   Ht 5' 2.99" (1.6 m)   Wt 161 lb 6.4 oz (73.2 kg)   BMI 28.60 kg/m?   ? ?Subjective:  ? ? Patient ID: Maria Reynolds, female    DOB: 1981/01/19, 41 y.o.   MRN: [id number] ? ?Chief Complaint  ?Patient presents with  ? Vaginal Discharge  ?  Started on Sunday  ? ? ?HPI: ?Maria Reynolds is a 41 y.o. female ? ?Vaginal Discharge ?The patient's primary symptoms include a genital odor and vaginal discharge. The patient's pertinent negatives include no genital itching, genital lesions, genital rash, missed menses, pelvic pain or vaginal bleeding. Primary symptoms comment: discharge creamy x started sunday. The current episode started in the past 7 days. The problem has been gradually worsening. Pertinent negatives include no abdominal pain, anorexia, chills, dysuria, fever, joint swelling, nausea, painful intercourse or rash.  ? ?Chief Complaint  ?Patient presents with  ? Vaginal Discharge  ?  Started on Sunday  ? ? ?Relevant past medical, surgical, family and social history reviewed and updated as indicated. Interim medical history since our last visit reviewed. ?Allergies and medications reviewed and updated. ? ?Review of Systems  ?Constitutional:  Negative for chills and fever.  ?Gastrointestinal:  Negative for abdominal pain, anorexia and nausea.  ?Genitourinary:  Positive for vaginal discharge. Negative for dysuria, missed menses and pelvic pain.  ?Skin:  Negative for rash.  ? ?Per HPI unless specifically indicated above ? ?   ?Objective:  ?  ?BP 132/77   Pulse 92   Temp 98.3 ?F (36.8 ?C) (Oral)   Ht 5' 2.99" (1.6 m)   Wt 161 lb 6.4 oz (73.2 kg)   BMI 28.60 kg/m?   ?Wt Readings from Last 3 Encounters:  ?02/11/22 161 lb 6.4 oz (73.2 kg)  ?01/19/22 157 lb 9.6 oz (71.5 kg)  ?01/15/22 158 lb (71.7 kg)  ?  ?Physical Exam ? ?Results for orders placed or performed in visit on 01/19/22  ?Comp Met (CMET)  ?Result Value Ref Range  ? Glucose 86 70 - 99 mg/dL  ? BUN 19  6 - 24 mg/dL  ? Creatinine, Ser 0.80 0.57 - 1.00 mg/dL  ? eGFR 95 >59 mL/min/1.73  ? BUN/Creatinine Ratio 24 (H) 9 - 23  ? Sodium 139 134 - 144 mmol/L  ? Potassium 4.8 3.5 - 5.2 mmol/L  ? Chloride 109 (H) 96 - 106 mmol/L  ? CO2 20 20 - 29 mmol/L  ? Calcium 8.9 8.7 - 10.2 mg/dL  ? Total Protein 6.9 6.0 - 8.5 g/dL  ? Albumin 4.1 3.8 - 4.8 g/dL  ? Globulin, Total 2.8 1.5 - 4.5 g/dL  ? Albumin/Globulin Ratio 1.5 1.2 - 2.2  ? Bilirubin Total <0.2 0.0 - 1.2 mg/dL  ? Alkaline Phosphatase 73 44 - 121 IU/L  ? AST 17 0 - 40 IU/L  ? ALT 18 0 - 32 IU/L  ?HgB A1c  ?Result Value Ref Range  ? Hgb A1c MFr Bld 5.6 4.8 - 5.6 %  ? Est. average glucose Bld gHb Est-mCnc 114 mg/dL  ? ?   ? ?No current outpatient medications on file.  ? ? ?Assessment & Plan:  ?Vaginal dischareg  ?Willl check wet prep ho BV ?Recurrent BV on todays sample ?Will start pt on flagyl  ?Discussed the need to tx the partner as she has had this recently. ? ?Fu with pcp for furhter mx  ? ?Problem  List Items Addressed This Visit   ? ?  ? Other  ? Vaginal discharge - Primary  ? Relevant Orders  ? Urinalysis, Routine w reflex microscopic  ? Urine Culture  ? WET PREP FOR [place], YEAST, CLUE  ?  ? ?Orders Placed This Encounter  ?Procedures  ? Urine Culture  ? WET PREP FOR [place], YEAST, CLUE  ? Urinalysis, Routine w reflex microscopic  ?  ? ?No orders of the defined types were placed in this encounter. ?  ? ?Follow up plan: ?No follow-ups on file. ? ?

## 2022-02-13 LAB — URINE CULTURE

## 2022-03-20 ENCOUNTER — Other Ambulatory Visit (HOSPITAL_COMMUNITY)
Admission: RE | Admit: 2022-03-20 | Discharge: 2022-03-20 | Disposition: A | Discharge status: Home / Self Care | Payer: Medicaid Other | Source: Ambulatory Visit | Attending: Obstetrics | Admitting: Obstetrics

## 2022-03-20 ENCOUNTER — Ambulatory Visit (INDEPENDENT_AMBULATORY_CARE_PROVIDER_SITE_OTHER): Payer: Managed Care, Other (non HMO) | Admitting: Obstetrics

## 2022-03-20 VITALS — BP 120/82 | Ht 64.0 in | Wt 157.0 lb

## 2022-03-20 DIAGNOSIS — Z113 Encounter for screening for infections with a predominantly sexual mode of transmission: Secondary | ICD-10-CM | POA: Insufficient documentation

## 2022-03-20 DIAGNOSIS — N898 Other specified noninflammatory disorders of vagina: Secondary | ICD-10-CM | POA: Insufficient documentation

## 2022-03-20 NOTE — Progress Notes (Signed)
Ms. Maria Reynolds is a 41 y.o. [phone] who LMP was Patient's last menstrual period was 03/14/2022 (exact date)., presents today for a problem visit.   Patient complains of an abnormal vaginal discharge which was treated approximately 2 months ago and is recurring since cessation of menses. Discharge described as: copious, white. Vaginal symptoms include odor.   Menstrual pattern: She had been bleeding regularly. Contraception: tubal ligation.  She reports recent antibiotic exposure (metronidazole), denies changes in soaps, detergents coinciding with the onset of her symptoms.  She does report using cotton underwear and apple cider vinegar, and boric acid vaginal suppositories without resolution of symptoms. She has previously self treated or been under treatment by another provider for these symptoms. She reports that she and female partner do not use condoms during intercourse.  ?BP 120/82   Ht 5\' 4"  (1.626 m)   Wt 71.2 kg   LMP 03/14/2022 (Exact Date)   BMI 26.95 kg/m?   ? ?\Review of Systems  ?Constitutional: Negative.   ?HENT: Negative.    ?Eyes: Negative.   ?Respiratory: Negative.    ?Cardiovascular: Negative.   ?Gastrointestinal: Negative.   ?Genitourinary: Negative.   ?Musculoskeletal: Negative.   ?Skin: Negative.   ?Neurological: Negative.   ?Endo/Heme/Allergies: Negative.   ?Psychiatric/Behavioral: Negative.     ?Physical Exam ?Constitutional:   ?   General: She is not in acute distress. ?   Appearance: Normal appearance.  ?HENT:  ?   Head: Normocephalic and atraumatic.  ?Pulmonary:  ?   Effort: Pulmonary effort is normal.  ?Genitourinary: ?   General: Normal vulva.  ?   Comments: White discharge visualized on external exam. Speculum inserted without complaint to reveal copious light yellow-green , thin discharge at cervical os as well as in vaginal canal. Aptima swab obtained.  ?Musculoskeletal:     ?   General: Normal range of motion.  ?Skin: ?   General: Skin is warm and dry.  ?Neurological:  ?    General: No focal deficit present.  ?   Mental Status: She is alert and oriented to person, place, and time.  ?Psychiatric:     ?   Mood and Affect: Mood normal.     ?   Behavior: Behavior normal.     ?   Thought Content: Thought content normal.     ?   Judgment: Judgment normal.  ?  ?1.) Risk factors for bacterial vaginosis and candida infections discussed.  We discussed normal vaginal flora/microbiome.  Any factors that may alter the microbiome increase the risk of these opportunistic infections.  These include changes in pH, antibiotic exposures, diabetes, wet bathing suits etc.  We discussed that treatment is aimed at eradicating abnormal bacterial overgrowth and or yeast.  There may be some role for vaginal probiotics in restoring normal vaginal flora.    ?2.) Results of vaginal swab will be communicated via the patient portal.  ?3.) Once results of vaginal swab are resulted, appropriate pharmaceutical therapy will be ordered.  ?4.) Counseled on using condoms to minimize disruption of vaginal microbiome due to exposure to seminal fluid with intercourse.  ?  ?03/16/2022, SNM ?03/20/2022 [id number] ?

## 2022-03-24 LAB — CERVICOVAGINAL ANCILLARY ONLY
Bacterial Vaginitis (gardnerella): POSITIVE — AB
Candida Glabrata: NEGATIVE
Candida Vaginitis: NEGATIVE
Chlamydia: NEGATIVE
Comment: NEGATIVE
Comment: NEGATIVE
Comment: NEGATIVE
Comment: NEGATIVE
Comment: NEGATIVE
Comment: NORMAL
Neisseria Gonorrhea: NEGATIVE
Trichomonas: POSITIVE — AB

## 2022-03-25 ENCOUNTER — Other Ambulatory Visit: Payer: Self-pay | Admitting: Obstetrics

## 2022-03-25 ENCOUNTER — Encounter: Payer: Self-pay | Admitting: Obstetrics

## 2022-03-25 DIAGNOSIS — A599 Trichomoniasis, unspecified: Secondary | ICD-10-CM

## 2022-03-25 MED ORDER — METRONIDAZOLE 500 MG PO TABS: 500.0000 mg | ORAL_TABLET | Freq: Two times a day (BID) | ORAL | 0 refills | Status: AC

## 2022-03-25 NOTE — Progress Notes (Signed)
Aptima swab from recent appointment shows + Trichomoniasis and BV. A prescription for Metronidazole sent to her pharmacy, and she is notified via MY Chart. ? ?Mirna Mires, CNM  ?03/25/2022 9:11 PM  ? ?

## 2022-04-21 NOTE — Progress Notes (Unsigned)
There were no vitals taken for this visit.   Subjective:    Patient ID: Maria Reynolds, female    DOB: May 12, 1981, 41 y.o.   MRN: [id number]  HPI: Maria Reynolds is a 41 y.o. female presenting on 04/22/2022 for comprehensive medical examination. Current medical complaints include:{Blank single:19197::"none","***"}  She currently lives with: Menopausal Symptoms: {Blank single:19197::"yes","no"}  ANEMIA Anemia status: {Blank single:19197::"controlled","uncontrolled","better","worse","exacerbated","stable"} Etiology of anemia: Duration of anemia treatment:  Compliance with treatment: {Blank single:19197::"excellent compliance","good compliance","fair compliance","poor compliance"} Iron supplementation side effects: {Blank single:19197::"yes","no"} Severity of anemia: {Blank single:19197::"mild","moderate","severe"} Fatigue: {Blank single:19197::"yes","no"} Decreased exercise tolerance: {Blank single:19197::"yes","no"}  Dyspnea on exertion: {Blank single:19197::"yes","no"} Palpitations: {Blank single:19197::"yes","no"} Bleeding: {Blank single:19197::"yes","no"} Pica: {Blank single:19197::"yes","no"}  Depression Screen done today and results listed below:     02/11/2022    4:06 PM 01/19/2022    8:45 AM  Depression screen PHQ 2/9  Decreased Interest 0 0  Down, Depressed, Hopeless 0 0  PHQ - 2 Score 0 0  Altered sleeping 0 0  Tired, decreased energy 0 0  Change in appetite 0 0  Feeling bad or failure about yourself  0 0  Trouble concentrating 0 0  Moving slowly or fidgety/restless 0 0  Suicidal thoughts 0 0  PHQ-9 Score 0 0  Difficult doing work/chores Not difficult at all Not difficult at all    The patient {has/does not have:19849} a history of falls. I {did/did not:19850} complete a risk assessment for falls. A plan of care for falls {was/was not:19852} documented.   Past Medical History:  Past Medical History:  Diagnosis Date   Gestational diabetes    Herpes simplex     Intermenstrual spotting due to IUD 06/28/2014   IUD (intrauterine device) in place 06/28/2014   Preterm labor    Vaginal discharge 06/28/2014    Surgical History:  Past Surgical History:  Procedure Laterality Date   NO PAST SURGERIES     TUBAL LIGATION N/A 09/03/2017   Procedure: POST PARTUM TUBAL LIGATION;  Surgeon: Ward, Elenora Fender, MD;  Location: [facility] ORS;  Service: Gynecology;  Laterality: N/A;    Medications:  No current outpatient medications on file prior to visit.   No current facility-administered medications on file prior to visit.    Allergies:  No Known Allergies  Social History:  Social History   Socioeconomic History   Marital status: Single    Spouse name: Not on file   Number of children: Not on file   Years of education: Not on file   Highest education level: Not on file  Occupational History   Not on file  Tobacco Use   Smoking status: Every Day    Packs/day: 0.25    Types: Cigarettes   Smokeless tobacco: Never   Tobacco comments:    started on Wellbutrin 150 mg 12/2020  Vaping Use   Vaping Use: Never used  Substance and Sexual Activity   Alcohol use: Yes    Comment: occ   Drug use: No   Sexual activity: Yes    Birth control/protection: Surgical  Other Topics Concern   Not on file  Social History Narrative   Not on file   Social Determinants of Health   Financial Resource Strain: Not on file  Food Insecurity: Not on file  Transportation Needs: Not on file  Physical Activity: Not on file  Stress: Not on file  Social Connections: Not on file  Intimate Partner Violence: Not on file   Social History   Tobacco Use  Smoking  Status Every Day   Packs/day: 0.25   Types: Cigarettes  Smokeless Tobacco Never  Tobacco Comments   started on Wellbutrin 150 mg 12/2020   Social History   Substance and Sexual Activity  Alcohol Use Yes   Comment: occ    Family History:  Family History  Problem Relation Age of Onset   COPD Mother     Emphysema Mother    Hypertension Father    Ovarian cancer Maternal Aunt    Throat cancer Maternal Grandmother    Cancer Paternal Grandmother        lung   Dementia Paternal Grandmother     Past medical history, surgical history, medications, allergies, family history and social history reviewed with patient today and changes made to appropriate areas of the chart.   ROS All other ROS negative except what is listed above and in the HPI.      Objective:    There were no vitals taken for this visit.  Wt Readings from Last 3 Encounters:  03/20/22 157 lb (71.2 kg)  02/11/22 161 lb 6.4 oz (73.2 kg)  01/19/22 157 lb 9.6 oz (71.5 kg)    Physical Exam  Results for orders placed or performed in visit on 03/20/22  Cervicovaginal ancillary only  Result Value Ref Range   Neisseria Gonorrhea Negative    Chlamydia Negative    Trichomonas Positive (A)    Bacterial Vaginitis (gardnerella) Positive (A)    Candida Vaginitis Negative    Candida Glabrata Negative    Comment      Normal Reference Range Bacterial Vaginosis - Negative   Comment Normal Reference Range Candida Species - Negative    Comment Normal Reference Range Candida Galbrata - Negative    Comment Normal Reference Range Trichomonas - Negative    Comment Normal Reference Ranger Chlamydia - Negative    Comment      Normal Reference Range Neisseria Gonorrhea - Negative      Assessment & Plan:   Problem List Items Addressed This Visit       Other   Trichimoniasis - Primary     Follow up plan: No follow-ups on file.   LABORATORY TESTING:  - Pap smear: {Blank single:19197::"pap done","not applicable","up to date","done elsewhere"}  IMMUNIZATIONS:   - Tdap: Tetanus vaccination status reviewed: {tetanus status:315746}. - Influenza: {Blank single:19197::"Up to date","Administered today","Postponed to flu season","Refused","Given elsewhere"} - Pneumovax: {Blank single:19197::"Up to date","Administered today","Not  applicable","Refused","Given elsewhere"} - Prevnar: {Blank single:19197::"Up to date","Administered today","Not applicable","Refused","Given elsewhere"} - COVID: {Blank single:19197::"Up to date","Administered today","Not applicable","Refused","Given elsewhere"} - HPV: {Blank single:19197::"Up to date","Administered today","Not applicable","Refused","Given elsewhere"} - Shingrix vaccine: {Blank single:19197::"Up to date","Administered today","Not applicable","Refused","Given elsewhere"}  SCREENING: -Mammogram: {Blank single:19197::"Up to date","Ordered today","Not applicable","Refused","Done elsewhere"}  - Colonoscopy: {Blank single:19197::"Up to date","Ordered today","Not applicable","Refused","Done elsewhere"}  - Bone Density: {Blank single:19197::"Up to date","Ordered today","Not applicable","Refused","Done elsewhere"}  -Hearing Test: {Blank single:19197::"Up to date","Ordered today","Not applicable","Refused","Done elsewhere"}  -Spirometry: {Blank single:19197::"Up to date","Ordered today","Not applicable","Refused","Done elsewhere"}   PATIENT COUNSELING:   Advised to take 1 mg of folate supplement per day if capable of pregnancy.   Sexuality: Discussed sexually transmitted diseases, partner selection, use of condoms, avoidance of unintended pregnancy  and contraceptive alternatives.   Advised to avoid cigarette smoking.  I discussed with the patient that most people either abstain from alcohol or drink within safe limits (<=14/week and <=4 drinks/occasion for males, <=7/weeks and <= 3 drinks/occasion for females) and that the risk for alcohol disorders and other health effects rises proportionally with the number of  drinks per week and how often a drinker exceeds daily limits.  Discussed cessation/primary prevention of drug use and availability of treatment for abuse.   Diet: Encouraged to adjust caloric intake to maintain  or achieve ideal body weight, to reduce intake of dietary  saturated fat and total fat, to limit sodium intake by avoiding high sodium foods and not adding table salt, and to maintain adequate dietary potassium and calcium preferably from fresh fruits, vegetables, and low-fat dairy products.    stressed the importance of regular exercise  Injury prevention: Discussed safety belts, safety helmets, smoke detector, smoking near bedding or upholstery.   Dental health: Discussed importance of regular tooth brushing, flossing, and dental visits.    NEXT PREVENTATIVE PHYSICAL DUE IN 1 YEAR. No follow-ups on file.

## 2022-04-22 ENCOUNTER — Encounter: Payer: Self-pay | Admitting: Nurse Practitioner

## 2022-04-22 ENCOUNTER — Ambulatory Visit (INDEPENDENT_AMBULATORY_CARE_PROVIDER_SITE_OTHER): Payer: Managed Care, Other (non HMO) | Admitting: Nurse Practitioner

## 2022-04-22 VITALS — BP 118/76 | HR 77 | Temp 98.2°F | Ht 62.6 in | Wt 155.0 lb

## 2022-04-22 DIAGNOSIS — Z136 Encounter for screening for cardiovascular disorders: Secondary | ICD-10-CM

## 2022-04-22 DIAGNOSIS — D509 Iron deficiency anemia, unspecified: Secondary | ICD-10-CM

## 2022-04-22 DIAGNOSIS — Z0000 Encounter for general adult medical examination without abnormal findings: Secondary | ICD-10-CM | POA: Diagnosis not present

## 2022-04-22 DIAGNOSIS — Z1231 Encounter for screening mammogram for malignant neoplasm of breast: Secondary | ICD-10-CM

## 2022-04-22 DIAGNOSIS — Z1159 Encounter for screening for other viral diseases: Secondary | ICD-10-CM | POA: Diagnosis not present

## 2022-04-22 LAB — URINALYSIS, ROUTINE W REFLEX MICROSCOPIC
Bilirubin, UA: NEGATIVE
Glucose, UA: NEGATIVE
Leukocytes,UA: NEGATIVE
Nitrite, UA: NEGATIVE
Specific Gravity, UA: >1.03 — ABNORMAL HIGH (ref 1.005–1.030)
Urobilinogen, Ur: 1 mg/dL (ref 0.2–1.0)
pH, UA: 5.5 (ref 5.0–7.5)

## 2022-04-22 LAB — MICROSCOPIC EXAMINATION
Bacteria, UA: NONE SEEN
WBC, UA: NONE SEEN /hpf (ref 0–5)

## 2022-04-22 NOTE — Patient Instructions (Signed)
Please call to schedule your mammogram and/or bone density: ?[facility]  ?Address: [address], [place], [place] [address] ?Phone: [phone]  ?

## 2022-04-22 NOTE — Assessment & Plan Note (Signed)
Chronic.  Controlled.  Continue with current medication regimen.  Labs ordered today.  Return to clinic in 6 months for reevaluation.  Call sooner if concerns arise.  ? ?

## 2022-04-23 LAB — CBC WITH DIFFERENTIAL/PLATELET
Basophils Absolute: 0 10*3/uL (ref 0.0–0.2)
Basos: 1 %
EOS (ABSOLUTE): 0.1 10*3/uL (ref 0.0–0.4)
Eos: 1 %
Hematocrit: 37.4 % (ref 34.0–46.6)
Hemoglobin: 11.6 g/dL (ref 11.1–15.9)
Immature Grans (Abs): 0 10*3/uL (ref 0.0–0.1)
Immature Granulocytes: 0 %
Lymphocytes Absolute: 1.5 10*3/uL (ref 0.7–3.1)
Lymphs: 29 %
MCH: 27 pg (ref 26.6–33.0)
MCHC: 31 g/dL — ABNORMAL LOW (ref 31.5–35.7)
MCV: 87 fL (ref 79–97)
Monocytes Absolute: 0.5 10*3/uL (ref 0.1–0.9)
Monocytes: 10 %
Neutrophils Absolute: 3.1 10*3/uL (ref 1.4–7.0)
Neutrophils: 59 %
Platelets: 273 10*3/uL (ref 150–450)
RBC: 4.29 x10E6/uL (ref 3.77–5.28)
RDW: 12.4 % (ref 11.7–15.4)
WBC: 5.2 10*3/uL (ref 3.4–10.8)

## 2022-04-23 LAB — LIPID PANEL
Chol/HDL Ratio: 3.9 ratio (ref 0.0–4.4)
Cholesterol, Total: 159 mg/dL (ref 100–199)
HDL: 41 mg/dL (ref >39)
LDL Chol Calc (NIH): 108 mg/dL — ABNORMAL HIGH (ref 0–99)
Triglycerides: 48 mg/dL (ref 0–149)
VLDL Cholesterol Cal: 10 mg/dL (ref 5–40)

## 2022-04-23 LAB — COMPREHENSIVE METABOLIC PANEL
ALT: 20 IU/L (ref 0–32)
AST: 18 IU/L (ref 0–40)
Albumin/Globulin Ratio: 1.3 (ref 1.2–2.2)
Albumin: 3.8 g/dL (ref 3.8–4.8)
Alkaline Phosphatase: 74 IU/L (ref 44–121)
BUN/Creatinine Ratio: 13 (ref 9–23)
BUN: 9 mg/dL (ref 6–24)
Bilirubin Total: 0.3 mg/dL (ref 0.0–1.2)
CO2: 24 mmol/L (ref 20–29)
Calcium: 8.9 mg/dL (ref 8.7–10.2)
Chloride: 105 mmol/L (ref 96–106)
Creatinine, Ser: 0.72 mg/dL (ref 0.57–1.00)
Globulin, Total: 2.9 g/dL (ref 1.5–4.5)
Glucose: 81 mg/dL (ref 70–99)
Potassium: 4 mmol/L (ref 3.5–5.2)
Sodium: 138 mmol/L (ref 134–144)
Total Protein: 6.7 g/dL (ref 6.0–8.5)
eGFR: 108 mL/min/{1.73_m2} (ref >59)

## 2022-04-23 LAB — TSH: TSH: 0.623 u[IU]/mL (ref 0.450–4.500)

## 2022-04-23 LAB — HEPATITIS C ANTIBODY: Hep C Virus Ab: NONREACTIVE

## 2022-04-23 NOTE — Progress Notes (Signed)
HI Maria Reynolds.  It was nice to see you yesterday.  Your lab work looks good.  No concerns at this time. Continue with your current medication regimen.  Follow up as discussed.  Please let me know if you have any questions.

## 2022-10-28 ENCOUNTER — Encounter: Payer: Self-pay | Admitting: Physician Assistant

## 2022-10-28 ENCOUNTER — Ambulatory Visit (INDEPENDENT_AMBULATORY_CARE_PROVIDER_SITE_OTHER): Payer: Managed Care, Other (non HMO) | Admitting: Physician Assistant

## 2022-10-28 VITALS — BP 109/67 | HR 65 | Temp 98.5°F | Wt 159.0 lb

## 2022-10-28 DIAGNOSIS — N898 Other specified noninflammatory disorders of vagina: Secondary | ICD-10-CM | POA: Diagnosis not present

## 2022-10-28 LAB — WET PREP FOR TRICH, YEAST, CLUE
Clue Cell Exam: NEGATIVE
Trichomonas Exam: NEGATIVE
Yeast Exam: NEGATIVE

## 2022-10-28 NOTE — Progress Notes (Signed)
Acute Office Visit   Patient: Maria Reynolds   DOB: 1981/05/13   41 y.o. Female  MRN: [id number] Visit Date: 10/28/2022  Today's healthcare provider: Oswaldo Conroy [place], PA-C  Introduced myself to the patient as a Secondary school teacher and provided education on APPs in clinical practice.    Chief Complaint  Patient presents with   Vaginal Discharge     Patient states she's had vaginal discharge and odor for about a week    Subjective    Vaginal Discharge The patient's primary symptoms include vaginal discharge. The patient's pertinent negatives include no pelvic pain. Pertinent negatives include no chills, dysuria, fever or flank pain.   HPI     Vaginal Discharge    Additional comments:  Patient states she's had vaginal discharge and odor for about a week       Last edited by Rolley Sims, CMA on 10/28/2022 11:11 AM.       Concern for Vaginal discharge  States she has noticed these changes for about a week  States she has noticed a change to her vaginal discharge and an odor  States she thought it was BV  She does not report concerns for STDs   She states last night she did an apple cider vinegar bath  She tried using a boric acid suppository last week but did not do the full 7 days Reports odor was resolved after the suppository but came back  She states she was on her cycle until last Thurs    Medications: No outpatient medications prior to visit.   No facility-administered medications prior to visit.    Review of Systems  Constitutional:  Negative for chills and fever.  Genitourinary:  Positive for vaginal discharge. Negative for dyspareunia, dysuria, enuresis, flank pain, pelvic pain, vaginal bleeding and vaginal pain.       Objective    BP 109/67   Pulse 65   Temp 98.5 F (36.9 C)   Wt 159 lb (72.1 kg)   SpO2 98%   BMI 28.53 kg/m    Physical Exam Vitals reviewed.  Constitutional:      General: She is awake.     Appearance: Normal appearance. She is  well-developed and well-groomed.  HENT:     Head: Normocephalic and atraumatic.  Pulmonary:     Effort: Pulmonary effort is normal.  Neurological:     Mental Status: She is alert.  Psychiatric:        Attention and Perception: Attention and perception normal.        Mood and Affect: Mood and affect normal.        Speech: Speech normal.        Behavior: Behavior normal. Behavior is cooperative.      No results found for any visits on 10/28/22.  Assessment & Plan      No follow-ups on file.      Problem List Items Addressed This Visit       Other   Vaginal discharge - Primary Acute, recurrent concern Reports symptoms are similar to previous BV infections- administered Boric acid suppository last week at home Wet prep was negative for clue cells, yeast, and trichomonas at this time Recommend she try to finish course of Boric acid suppositories if still having symptoms and can return if this does not provide relief for pelvic and repeat swab Follow up as needed for persistent or progressing symptoms     Relevant Orders  WET PREP FOR TRICH, YEAST, CLUE (Completed)     No follow-ups on file.   I, Alaiza Yau E Jumana Paccione, PA-C, have reviewed all documentation for this visit. The documentation on 10/28/22 for the exam, diagnosis, procedures, and orders are all accurate and complete.   Jacquelin Hawking, MHS, PA-C [facility]

## 2024-05-05 ENCOUNTER — Other Ambulatory Visit: Payer: Self-pay

## 2024-05-05 ENCOUNTER — Emergency Department
Admission: EM | Admit: 2024-05-05 | Discharge: 2024-05-05 | Disposition: A | Discharge status: Home / Self Care | Attending: Emergency Medicine | Admitting: Emergency Medicine

## 2024-05-05 DIAGNOSIS — H6982 Other specified disorders of Eustachian tube, left ear: Secondary | ICD-10-CM | POA: Insufficient documentation

## 2024-05-05 DIAGNOSIS — J029 Acute pharyngitis, unspecified: Secondary | ICD-10-CM | POA: Diagnosis not present

## 2024-05-05 DIAGNOSIS — H9202 Otalgia, left ear: Secondary | ICD-10-CM | POA: Diagnosis present

## 2024-05-05 DIAGNOSIS — H6992 Unspecified Eustachian tube disorder, left ear: Secondary | ICD-10-CM

## 2024-05-05 LAB — RESP PANEL BY RT-PCR (RSV, FLU A&B, COVID)  RVPGX2
Influenza A by PCR: NEGATIVE
Influenza B by PCR: NEGATIVE
Resp Syncytial Virus by PCR: NEGATIVE
SARS Coronavirus 2 by RT PCR: NEGATIVE

## 2024-05-05 LAB — GROUP A STREP BY PCR: Group A Strep by PCR: NOT DETECTED

## 2024-05-05 MED ORDER — FLUTICASONE PROPIONATE 50 MCG/ACT NA SUSP: 2.0000 | Freq: Every day | NASAL | 0 refills | Status: AC

## 2024-05-05 MED ORDER — PREDNISONE 10 MG PO TABS: ORAL_TABLET | ORAL | 0 refills | Status: AC

## 2024-05-05 NOTE — Discharge Instructions (Signed)
 Follow-up with your primary care provider if any continued problems or not improving in 4 to 5 days.  Increase fluids.  Prescription was sent to the pharmacy for you to begin taking.  You may also take Tylenol  if needed for pain with this medication if needed.

## 2024-05-05 NOTE — ED Triage Notes (Signed)
 Pt states sore throat for a week and a half, states that originally she thought she had the flu due to chills that she was having, states that she is also congested

## 2024-05-05 NOTE — ED Provider Notes (Signed)
 [facility] Provider Note    Event Date/Time   First MD Initiated Contact with Patient 05/05/24 1149     (approximate)   History   Sore Throat   HPI  Maria Reynolds is a 43 y.o. female   presents to the ED with complaint of sore throat for the 1-1/2 weeks.  Patient states she also has had pain with her left ear with decreased hearing and URI symptoms.      Physical Exam   Triage Vital Signs: ED Triage Vitals  Encounter Vitals Group     BP 05/05/24 1134 (!) 113/91     Systolic BP Percentile --      Diastolic BP Percentile --      Pulse Rate 05/05/24 1134 88     Resp 05/05/24 1134 16     Temp 05/05/24 1134 99.5 F (37.5 C)     Temp Source 05/05/24 1134 Oral     SpO2 05/05/24 1134 97 %     Weight 05/05/24 1135 150 lb (68 kg)     Height 05/05/24 1135 5\' 5"  (1.651 m)     Head Circumference --      Peak Flow --      Pain Score 05/05/24 1134 10     Pain Loc --      Pain Education --      Exclude from Growth Chart --     Most recent vital signs: Vitals:   05/05/24 1134  BP: (!) 113/91  Pulse: 88  Resp: 16  Temp: 99.5 F (37.5 C)  SpO2: 97%     General: Awake, no distress.  Able to talk in complete sentences without any difficulty. CV:  Good peripheral perfusion.  Resp:  Normal effort.  Abd:  No distention.  Other:  Posterior pharynx with minimal injection, no exudate and uvula is midline.  Oral mucosas moist.  Right EAC and TM are without injection or erythema.  Poor light reflex is noted.  Left TM with poor light reflex but no erythema or injection noted.  Neck is supple with minimal cervical lymphadenopathy on the left.   ED Results / Procedures / Treatments   Labs (all labs ordered are listed, but only abnormal results are displayed) Labs Reviewed  GROUP A STREP BY PCR  RESP PANEL BY RT-PCR (RSV, FLU A&B, COVID)  [id number]      PROCEDURES:  Critical Care performed:   Procedures   MEDICATIONS ORDERED IN ED: Medications  - No data to display   IMPRESSION / MDM / ASSESSMENT AND PLAN / ED COURSE  I reviewed the triage vital signs and the nursing notes.   Differential diagnosis includes, but is not limited to, COVID, influenza, RSV, strep pharyngitis, viral URI, viral pharyngitis, eustachian tube dysfunction, seasonal allergies.  43 year old female presents to the ED with complaint of sore throat for 1-1/2 weeks and pain and decreased hearing in her left ear.  Respiratory panel and strep test were negative and patient was made aware.  Her eustachian tube on the left appears to have some fluid behind the TM and will be treated for eustachian tube dysfunction.  A prescription for Flonase nasal spray and prednisone  was sent to the pharmacy for her to begin taking.  She is aware that she can take Tylenol  with this medication if additional pain medication is needed.  Increase fluids.      Patient's presentation is most consistent with acute complicated illness / injury requiring diagnostic workup.  FINAL CLINICAL IMPRESSION(S) / ED DIAGNOSES   Final diagnoses:  Dysfunction of left eustachian tube     Rx / DC Orders   ED Discharge Orders          Ordered    fluticasone (FLONASE) 50 MCG/ACT nasal spray  Daily        05/05/24 1258    predniSONE  (DELTASONE ) 10 MG tablet        05/05/24 1258             Note:  This document was prepared using Dragon voice recognition software and may include unintentional dictation errors.   Stafford Eagles, PA-C 05/05/24 1311    Bryson Carbine, MD 05/05/24 1314

## 2024-10-12 ENCOUNTER — Encounter: Admitting: Nurse Practitioner

## 2024-10-12 NOTE — Progress Notes (Deleted)
 There were no vitals taken for this visit.   Subjective:    Patient ID: Maria Reynolds, female    DOB: 1981/11/14, 43 y.o.   MRN: [id number]  HPI: Maria Reynolds is a 43 y.o. female presenting on 10/12/2024 for comprehensive medical examination. Current medical complaints include:{Blank single:19197::none,***}  She currently lives with: Menopausal Symptoms: {Blank single:19197::yes,no}  Depression Screen done today and results listed below:     04/22/2022    9:06 AM 02/11/2022    4:06 PM 01/19/2022    8:45 AM  Depression screen PHQ 2/9  Decreased Interest 1 0 0  Down, Depressed, Hopeless 1 0 0  PHQ - 2 Score 2 0 0  Altered sleeping 1 0 0  Tired, decreased energy 1 0 0  Change in appetite 0 0 0  Feeling bad or failure about yourself  0 0 0  Trouble concentrating 0 0 0  Moving slowly or fidgety/restless 0 0 0  Suicidal thoughts 0 0 0  PHQ-9 Score 4  0  0   Difficult doing work/chores Not difficult at all Not difficult at all Not difficult at all     Data saved with a previous flowsheet row definition    The patient {has/does not have:19849} a history of falls. I {did/did not:19850} complete a risk assessment for falls. A plan of care for falls {was/was not:19852} documented.   Past Medical History:  Past Medical History:  Diagnosis Date   Gestational diabetes    Herpes simplex    Intermenstrual spotting due to IUD 06/28/2014   IUD (intrauterine device) in place 06/28/2014   Preterm labor    Vaginal discharge 06/28/2014    Surgical History:  Past Surgical History:  Procedure Laterality Date   NO PAST SURGERIES     TUBAL LIGATION N/A 09/03/2017   Procedure: POST PARTUM TUBAL LIGATION;  Surgeon: Ward, Mitzie BROCKS, MD;  Location: [facility] ORS;  Service: Gynecology;  Laterality: N/A;    Medications:  Current Outpatient Medications on File Prior to Visit  Medication Sig   fluticasone  (FLONASE ) 50 MCG/ACT nasal spray Place 2 sprays into both nostrils daily.    predniSONE  (DELTASONE ) 10 MG tablet Take 6 tablets  today, on day 2 take 5 tablets, day 3 take 4 tablets, day 4 take 3 tablets, day 5 take  2 tablets and 1 tablet the last day   No current facility-administered medications on file prior to visit.    Allergies:  No Known Allergies  Social History:  Social History   Socioeconomic History   Marital status: Single    Spouse name: Not on file   Number of children: Not on file   Years of education: Not on file   Highest education level: Not on file  Occupational History   Not on file  Tobacco Use   Smoking status: Every Day    Current packs/day: 0.25    Types: Cigarettes   Smokeless tobacco: Never   Tobacco comments:    started on Wellbutrin  150 mg 12/2020  Vaping Use   Vaping status: Never Used  Substance and Sexual Activity   Alcohol use: Yes    Comment: occ   Drug use: No   Sexual activity: Yes    Birth control/protection: Surgical  Other Topics Concern   Not on file  Social History Narrative   Not on file   Social Drivers of Health   Financial Resource Strain: Not on file  Food Insecurity: Not on file  Transportation Needs:  Not on file  Physical Activity: Not on file  Stress: Not on file  Social Connections: Not on file  Intimate Partner Violence: Not on file   Social History   Tobacco Use  Smoking Status Every Day   Current packs/day: 0.25   Types: Cigarettes  Smokeless Tobacco Never  Tobacco Comments   started on Wellbutrin  150 mg 12/2020   Social History   Substance and Sexual Activity  Alcohol Use Yes   Comment: occ    Family History:  Family History  Problem Relation Age of Onset   COPD Mother    Emphysema Mother    Hypertension Father    Ovarian cancer Maternal Aunt    Throat cancer Maternal Grandmother    Cancer Paternal Grandmother        lung   Dementia Paternal Grandmother     Past medical history, surgical history, medications, allergies, family history and social history reviewed  with patient today and changes made to appropriate areas of the chart.   ROS All other ROS negative except what is listed above and in the HPI.      Objective:    There were no vitals taken for this visit.  Wt Readings from Last 3 Encounters:  05/05/24 150 lb (68 kg)  10/28/22 159 lb (72.1 kg)  04/22/22 155 lb (70.3 kg)    Physical Exam  Results for orders placed or performed during the hospital encounter of 05/05/24  Group A Strep by PCR   Collection Time: 05/05/24 11:35 AM   Specimen: Throat; Sterile Swab  Result Value Ref Range   Group A Strep by PCR NOT DETECTED NOT DETECTED  Resp panel by RT-PCR (RSV, Flu A&B, Covid) Throat   Collection Time: 05/05/24 11:35 AM   Specimen: Throat; Nasal Swab  Result Value Ref Range   SARS Coronavirus 2 by RT PCR NEGATIVE NEGATIVE   Influenza A by PCR NEGATIVE NEGATIVE   Influenza B by PCR NEGATIVE NEGATIVE   Resp Syncytial Virus by PCR NEGATIVE NEGATIVE      Assessment & Plan:   Problem List Items Addressed This Visit   None    Follow up plan: No follow-ups on file.   LABORATORY TESTING:  - Pap smear: {Blank single:19197::pap done,not applicable,up to date,done elsewhere}  IMMUNIZATIONS:   - Tdap: Tetanus vaccination status reviewed: {tetanus status:315746}. - Influenza: {Blank single:19197::Up to date,Administered today,Postponed to flu season,Refused,Given elsewhere} - Pneumovax: {Blank single:19197::Up to date,Administered today,Not applicable,Refused,Given elsewhere} - Prevnar: {Blank single:19197::Up to date,Administered today,Not applicable,Refused,Given elsewhere} - COVID: {Blank single:19197::Up to date,Administered today,Not applicable,Refused,Given elsewhere} - HPV: {Blank single:19197::Up to date,Administered today,Not applicable,Refused,Given elsewhere} - Shingrix vaccine: {Blank single:19197::Up to date,Administered today,Not  applicable,Refused,Given elsewhere}  SCREENING: -Mammogram: {Blank single:19197::Up to date,Ordered today,Not applicable,Refused,Done elsewhere}  - Colonoscopy: {Blank single:19197::Up to date,Ordered today,Not applicable,Refused,Done elsewhere}  - Bone Density: {Blank single:19197::Up to date,Ordered today,Not applicable,Refused,Done elsewhere}  -Hearing Test: {Blank single:19197::Up to date,Ordered today,Not applicable,Refused,Done elsewhere}  -Spirometry: {Blank single:19197::Up to date,Ordered today,Not applicable,Refused,Done elsewhere}   PATIENT COUNSELING:   Advised to take 1 mg of folate supplement per day if capable of pregnancy.   Sexuality: Discussed sexually transmitted diseases, partner selection, use of condoms, avoidance of unintended pregnancy  and contraceptive alternatives.   Advised to avoid cigarette smoking.  I discussed with the patient that most people either abstain from alcohol or drink within safe limits (<=14/week and <=4 drinks/occasion for males, <=7/weeks and <= 3 drinks/occasion for females) and that the risk for alcohol disorders and other health effects  rises proportionally with the number of drinks per week and how often a drinker exceeds daily limits.  Discussed cessation/primary prevention of drug use and availability of treatment for abuse.   Diet: Encouraged to adjust caloric intake to maintain  or achieve ideal body weight, to reduce intake of dietary saturated fat and total fat, to limit sodium intake by avoiding high sodium foods and not adding table salt, and to maintain adequate dietary potassium and calcium  preferably from fresh fruits, vegetables, and low-fat dairy products.    stressed the importance of regular exercise  Injury prevention: Discussed safety belts, safety helmets, smoke detector, smoking near bedding or upholstery.   Dental health: Discussed importance of regular tooth  brushing, flossing, and dental visits.    NEXT PREVENTATIVE PHYSICAL DUE IN 1 YEAR. No follow-ups on file.
